# Patient Record
Sex: Female | Born: 1965 | Race: White | Hispanic: No | Marital: Married | Smoking: Former smoker
Health system: Southern US, Community
[De-identification: ages and names within clinical notes are randomized; demographics above are authoritative.]

## PROBLEM LIST (undated history)

## (undated) ENCOUNTER — Ambulatory Visit: Discharge status: Absent without leave | Payer: BC Managed Care – PPO

## (undated) DIAGNOSIS — Z800 Family history of malignant neoplasm of digestive organs: Secondary | ICD-10-CM

## (undated) DIAGNOSIS — Z8042 Family history of malignant neoplasm of prostate: Secondary | ICD-10-CM

## (undated) DIAGNOSIS — T7840XA Allergy, unspecified, initial encounter: Secondary | ICD-10-CM

## (undated) DIAGNOSIS — C50919 Malignant neoplasm of unspecified site of unspecified female breast: Secondary | ICD-10-CM

## (undated) DIAGNOSIS — Z923 Personal history of irradiation: Secondary | ICD-10-CM

## (undated) HISTORY — DX: Family history of malignant neoplasm of digestive organs: Z80.0

## (undated) HISTORY — PX: WISDOM TOOTH EXTRACTION: SHX21

## (undated) HISTORY — DX: Family history of malignant neoplasm of prostate: Z80.42

## (undated) HISTORY — PX: RHINOPLASTY: SUR1284

## (undated) HISTORY — DX: Malignant neoplasm of unspecified site of unspecified female breast: C50.919

---

## 2000-03-09 ENCOUNTER — Other Ambulatory Visit: Admission: RE | Admit: 2000-03-09 | Discharge: 2000-03-09 | Payer: Self-pay | Admitting: Obstetrics and Gynecology

## 2000-12-06 ENCOUNTER — Ambulatory Visit (HOSPITAL_COMMUNITY): Admission: RE | Admit: 2000-12-06 | Discharge: 2000-12-06 | Payer: Self-pay | Admitting: Obstetrics and Gynecology

## 2000-12-06 ENCOUNTER — Encounter: Payer: Self-pay | Admitting: Obstetrics and Gynecology

## 2001-02-24 ENCOUNTER — Ambulatory Visit (HOSPITAL_COMMUNITY): Admission: RE | Admit: 2001-02-24 | Discharge: 2001-02-24 | Payer: Self-pay | Admitting: Family Medicine

## 2001-03-14 ENCOUNTER — Encounter: Admission: RE | Admit: 2001-03-14 | Discharge: 2001-03-14 | Payer: Self-pay | Admitting: Obstetrics and Gynecology

## 2001-05-20 ENCOUNTER — Inpatient Hospital Stay (HOSPITAL_COMMUNITY): Admission: AD | Admit: 2001-05-20 | Discharge: 2001-05-23 | Payer: Self-pay | Admitting: Obstetrics and Gynecology

## 2001-06-30 ENCOUNTER — Other Ambulatory Visit: Admission: RE | Admit: 2001-06-30 | Discharge: 2001-06-30 | Payer: Self-pay | Admitting: Obstetrics and Gynecology

## 2002-07-14 ENCOUNTER — Other Ambulatory Visit: Admission: RE | Admit: 2002-07-14 | Discharge: 2002-07-14 | Payer: Self-pay | Admitting: Obstetrics and Gynecology

## 2003-12-24 ENCOUNTER — Other Ambulatory Visit: Admission: RE | Admit: 2003-12-24 | Discharge: 2003-12-24 | Payer: Self-pay | Admitting: Obstetrics and Gynecology

## 2004-05-12 ENCOUNTER — Ambulatory Visit (HOSPITAL_COMMUNITY): Admission: RE | Admit: 2004-05-12 | Discharge: 2004-05-12 | Payer: Self-pay | Admitting: Obstetrics and Gynecology

## 2004-12-19 ENCOUNTER — Ambulatory Visit (HOSPITAL_COMMUNITY): Admission: RE | Admit: 2004-12-19 | Discharge: 2004-12-19 | Payer: Self-pay | Admitting: Obstetrics and Gynecology

## 2005-03-01 ENCOUNTER — Inpatient Hospital Stay (HOSPITAL_COMMUNITY): Admission: RE | Admit: 2005-03-01 | Discharge: 2005-03-03 | Payer: Self-pay | Admitting: Obstetrics & Gynecology

## 2005-03-05 ENCOUNTER — Inpatient Hospital Stay (HOSPITAL_COMMUNITY): Admission: AD | Admit: 2005-03-05 | Discharge: 2005-03-05 | Payer: Self-pay | Admitting: Obstetrics and Gynecology

## 2005-03-29 ENCOUNTER — Other Ambulatory Visit: Admission: RE | Admit: 2005-03-29 | Discharge: 2005-03-29 | Payer: Self-pay | Admitting: Obstetrics & Gynecology

## 2005-12-17 ENCOUNTER — Encounter (INDEPENDENT_AMBULATORY_CARE_PROVIDER_SITE_OTHER): Payer: Self-pay | Admitting: *Deleted

## 2005-12-17 ENCOUNTER — Encounter: Admission: RE | Admit: 2005-12-17 | Discharge: 2005-12-17 | Payer: Self-pay | Admitting: Obstetrics and Gynecology

## 2006-01-16 ENCOUNTER — Ambulatory Visit (HOSPITAL_BASED_OUTPATIENT_CLINIC_OR_DEPARTMENT_OTHER): Admission: RE | Admit: 2006-01-16 | Discharge: 2006-01-16 | Payer: Self-pay | Admitting: Surgery

## 2006-01-16 ENCOUNTER — Encounter (INDEPENDENT_AMBULATORY_CARE_PROVIDER_SITE_OTHER): Payer: Self-pay | Admitting: Specialist

## 2007-05-26 ENCOUNTER — Encounter: Admission: RE | Admit: 2007-05-26 | Discharge: 2007-05-26 | Payer: Self-pay | Admitting: Obstetrics and Gynecology

## 2007-06-02 ENCOUNTER — Encounter: Admission: RE | Admit: 2007-06-02 | Discharge: 2007-06-02 | Payer: Self-pay | Admitting: Obstetrics and Gynecology

## 2007-06-04 ENCOUNTER — Encounter: Admission: RE | Admit: 2007-06-04 | Discharge: 2007-06-04 | Payer: Self-pay | Admitting: Obstetrics and Gynecology

## 2008-01-02 ENCOUNTER — Encounter: Admission: RE | Admit: 2008-01-02 | Discharge: 2008-01-02 | Payer: Self-pay | Admitting: Obstetrics and Gynecology

## 2008-05-19 ENCOUNTER — Encounter: Admission: RE | Admit: 2008-05-19 | Discharge: 2008-05-19 | Payer: Self-pay | Admitting: Obstetrics and Gynecology

## 2009-04-04 ENCOUNTER — Encounter: Admission: RE | Admit: 2009-04-04 | Discharge: 2009-04-04 | Payer: Self-pay | Admitting: Obstetrics and Gynecology

## 2009-05-27 ENCOUNTER — Encounter: Admission: RE | Admit: 2009-05-27 | Discharge: 2009-05-27 | Payer: Self-pay | Admitting: Obstetrics and Gynecology

## 2010-03-26 ENCOUNTER — Encounter: Payer: Self-pay | Admitting: Obstetrics and Gynecology

## 2010-07-10 ENCOUNTER — Other Ambulatory Visit: Payer: Self-pay | Admitting: Obstetrics and Gynecology

## 2010-07-10 DIAGNOSIS — Z1231 Encounter for screening mammogram for malignant neoplasm of breast: Secondary | ICD-10-CM

## 2010-07-14 ENCOUNTER — Ambulatory Visit
Admission: RE | Admit: 2010-07-14 | Discharge: 2010-07-14 | Disposition: A | Discharge status: Home / Self Care | Payer: 59 | Source: Ambulatory Visit | Attending: Obstetrics and Gynecology | Admitting: Obstetrics and Gynecology

## 2010-07-14 DIAGNOSIS — Z1231 Encounter for screening mammogram for malignant neoplasm of breast: Secondary | ICD-10-CM

## 2010-07-21 NOTE — Op Note (Signed)
[facility]  Patient:    Pamela Whitney, Pamela Whitney Visit Number: [id number] MRN: [id number]          Service Type: OBS Location: 910A 9142 01 Attending Physician:  Osborn Coho Dictated by:   Devoria Albe Edward Jolly, M.D. Proc. Date: 05/20/01 Admit Date:  05/20/2001                             Operative Report  PREOPERATIVE DIAGNOSES:       1. A 39 plus three weeks gestation.                               2. Fetal macrosomia.                               3. Gestational diabetes mellitus.  POSTOPERATIVE DIAGNOSES:      1. A 39 plus three weeks gestation.                               2. Fetal macrosomia.                               3. Gestational diabetes mellitus.  OPERATION:                    Primary low segment transverse cesarean section.  SURGEON:                      Brook A. Edward Jolly, M.D.  ASSISTANT:                    Janeece Riggers. Dareen Piano, M.D.  IV FLUIDS:                    3200 cc of Ringers lactate.  ESTIMATED BLOOD LOSS:         1000 cc.  URINE OUTPUT:                 75 cc.  COMPLICATIONS:                None.  INDICATIONS FOR PROCEDURE:    The patient was a 45 year old gravida 1, para 0, at 37 plus three weeks gestation who was noted on ultrasound one day prior to have fetal macrosomia.  The patients antepartum course was also significant for gestational diabetes mellitus which was diet-controlled.  The patient was also noted to be Rh negative and was treated appropriately with RhoGAM during the pregnancy.  A discussion was held with the patient regarding her diagnosis and the decision was made to proceed with a primary low segment transverse cesarean section after the risks, benefits, and alternatives were discussed with her.  FINDINGS:                     A viable female infant was delivered at 8:08 with Apgars of 8 at one minute and 9 at five minutes.  The weight was noted to be 9 pounds and 7 ounces.  The amniotic fluid was  clear and the uterus, tubes, and ovaries were noted to be normal.  The placenta had a normal insertion of a three vessel cord.  DESCRIPTION OF PROCEDURE:  With an IV in place, the patient was taken to the operating room after she was properly identified.  The patient received a spinal anesthetic and was placed in the supine position.  The abdomen was sterilely prepped and a Foley catheter was sterilely placed inside the bladder.  She was then sterilely draped.  After adequate anesthesia was ensured, a Pfannenstiel incision was created sharply with the scalpel.  The subcutaneous tissue was divided using cautery. The scalpel was used to divide the rectus fascia in the midline and the incision was extended sharply with the Mayo scissors.  The rectus muscles were then separated from the overlying fascia superiorly and inferiorly, and the rectus muscles were divided sharply using the Mayo scissors.  The parietal peritoneum was entered sharply with the Metzenbaum scissors after it was grasped and elevated with two hemostat clamps.  The parietal peritoneal incision was then extended cranially and caudally.  A bladder retractor was placed over the lower uterine segment and the bladder flap was created sharply.  A transverse lower uterine segment incision was then created with a scalpel and it was extended bluntly.  Membranes were ruptured and clear fluid was noted.  A hand was inserted through the incision and the vertex was delivered without difficulty followed by the remainder of the infant.  The nares and mouth were suctioned and the cord was doubly clamped and cut, and the newborn was carried over to the awaiting pediatricians.  The newborn was noted to be vigorous.  Cord blood was obtained and the placenta was manually extracted.  The patient did receive clindamycin 900 mg intravenously as well as Pitocin 20 units intravenously.  The uterus was exteriorized before its closure.  The  uterine incision was closed with a single running locked suture of 0 chromic.  The uterus was then returned to the peritoneal cavity which was irrigated and suctioned.  Hemostasis was excellent.  The rectus muscles were brought together in the midline by placing figure-of-eight sutures of 3-0 chromic. The fascia was next closed with a running suture of 0 Vicryl.  The subcutaneous tissue was then irrigated and small bleeding vessels were cauterized with monopolar cautery.  Hemostasis was good.  The skin was closed with staples followed by a sterile bandage.  The uterus was expressed of remaining clots and the patient was then sent to the recovery room in stable and awake condition.  There were no complications to the procedure.  All needle, instrument, and sponge counts were correct. Dictated by:   Devoria Albe Edward Jolly, M.D. Attending Physician:  Osborn Coho DD:  05/20/01 TD:  05/21/01 Job: [id number]

## 2010-07-21 NOTE — Discharge Summary (Signed)
NAME:  DALEYZA, GADOMSKI NO.:  [id number]   MEDICAL RECORD NO.:  [id number]          PATIENT TYPE:  INP   LOCATION:  9119                          FACILITY:  [facility]   PHYSICIAN:  Gerrit Friends. Aldona Bar, M.D.   DATE OF BIRTH:  1965/12/07   DATE OF ADMISSION:  03/01/2005  DATE OF DISCHARGE:  03/03/2005                                 DISCHARGE SUMMARY   DISCHARGE DIAGNOSES:  1.  Term pregnancy delivered, 7 pounds 14 ounces, female infant, Apgars 9      and 9.  2.  Blood type O negative - RhoGAM given.  3.  Previous cesarean section.  4.  Postoperative anemia.   PROCEDURE:  Repeat low-transverse cesarean section.   SUMMARY:  This 45 year old, gravida 3, para 1 was taken to the operating  room, on March 01, 2005,  for repeat cesarean section at term.  Intraoperatively, a significant amount of adhesions were encountered  involving the dome of the bladder, the lower uterine segment, and the  anterior peritoneum.  There was a moderate amount of bleeding encountered,  resulting in the patient's postoperative anemia. Otherwise, cesarean section  went well with the delivery of a 7-pound 14-ounce female infant with good  Apgars.  Mother's postpartum course, in spite of her anemia, was excellent  and she was on the second day postoperatively ready for discharge.  She was  ambulating well, tolerating a regular diet well.  Vital signs were stable.  Breast-feeding going well.  She was afebrile.  Her wound was clean and dry.  And accordingly, she was discharged to home. Discharge hemaglobin 7.4.   DISCHARGE MEDICATIONS:  1.  Vitamins - one a day.  2.  Feosol capsules - one daily.  3.  She will use Motrin 600 mg every six hours as needed for cramping or      pain.  4.  Tylox one to two every four to six hours as needed for more severe pain.   1.  She will return to return to the maternity admissions area in      approximately 48 hours for staple removal and to have her wound Steri-      Stripped with Benzoin.  2.  She will return to the office followup probably around the end of      January.  3.  She was given an instruction sheet at time of discharge and understood      all instructions well.   DISCHARGE CONDITION:  Improved.      Gerrit Friends. Aldona Bar, M.D.  Electronically Signed     RMW/MEDQ  D:  03/03/2005  T:  03/03/2005  Job:  [id number]

## 2010-07-21 NOTE — Op Note (Signed)
NAME:  Pamela Whitney, Pamela Whitney NO.:  [id number]   MEDICAL RECORD NO.:  [id number]          PATIENT TYPE:  AMB   LOCATION:  DSC                          FACILITY:  [facility]   PHYSICIAN:  Currie Paris, M.D.DATE OF BIRTH:  Dec 29, 1965   DATE OF PROCEDURE:  01/16/2006  DATE OF DISCHARGE:                                 OPERATIVE REPORT   CCS [id number]   PREOPERATIVE DIAGNOSIS:  Left breast mass.   POSTOPERATIVE DIAGNOSIS:  Left breast mass.   OPERATION:  Excision left breast mass.   SURGEON:  Currie Paris, M.D.   ANESTHESIA:  MAC.   CLINICAL HISTORY:  [name] is a 45 year old lady who has a palpable mass  in the left breast.  She had some atypia seen on core biopsy and excisional  biopsy was recommended.   DESCRIPTION OF PROCEDURE:  The patient seen in the holding area and she had  no further questions.  We confirmed the left excisional breast biopsy as  planned procedure and the left breast was marked by the patient and myself.   The patient was taken to the operating room and prior to being given any  sedation I was able to palpate and marked the mass which was really about  the 5:30 position with her supine and her left arm extended.  She was then  given IV sedation.  I confirmed the position with the ultrasound and thought  that I could see the clip placed.  This was in the area that we had already  marked on the skin.   The breast was then prepped and draped.  The time-out occurred.   I infiltrated a combination 1% Xylocaine and 0.5% Marcaine with epi mixed  equally.  I made a curvilinear incision at the areolar edge and raised the  skin up going inferiorly until I got to the area in question and was over  it.  I did an excisional biopsy down towards the chest wall.  There was a  lot of very firm tissue with some ducts that appeared to be filled with  creamy material consistent either with severe fibrocystic or perhaps DCIS.  This was an irregular  area that did not really have any good margins around  it.  I thought I was completely around the area but we did get into it  coming around but I thought that the final margins left behind were away  from all of this, although if this turns out to be malignant, I think a  wider excision would be necessary.   Once this was down I was able to easily palpate a hard mass in the middle of  what I had taken out as well as a lot of the fibrocystic type materials at  the edge.   I infiltrated more local to make sure everything would stay comfortable  postoperatively.  Once everything was dry I closed breast in layers with 3-0  Vicryl followed by 4-0 Monocryl subcuticular and some Dermabond.   The patient tolerated the procedure well and there no operative  complications.  All counts  were correct.      Currie Paris, M.D.  Electronically Signed     CJS/MEDQ  D:  01/16/2006  T:  01/16/2006  Job:  [id number]   cc:   Malva Limes, M.D.

## 2010-07-21 NOTE — Op Note (Signed)
NAME:  Pamela Whitney, Pamela Whitney NO.:  [id number]   MEDICAL RECORD NO.:  [id number]          PATIENT TYPE:  INP   LOCATION:  9199                          FACILITY:  [facility]   PHYSICIAN:  Gerrit Friends. Aldona Bar, M.D.   DATE OF BIRTH:  10/28/1965   DATE OF PROCEDURE:  03/01/2005  DATE OF DISCHARGE:                                 OPERATIVE REPORT   PREOPERATIVE DIAGNOSES:  1.  Term pregnancy.  2.  Desire for repeat cesarean section.   POSTOPERATIVE DIAGNOSES:  1.  Term pregnancy.  2.  Desire for repeat cesarean section.  3.  Delivery of 7 pound 14 ounce female infant, Apgars 9 and 9+.  4.  Adhesions involving lower uterine segment, anterior peritoneum and dome      of bladder.   PROCEDURES:  1.  Repeat low transverse cesarean section.  2.  Lysis of adhesions.   SURGEON:  Gerrit Friends. Aldona Bar, M.D.   ASSISTANT:  Miguel Aschoff, M.D.   ANESTHESIA:  Spinal.   HISTORY:  This 45 year old gravida 3, para 1, was admitted at term for  elective repeat cesarean section, having been delivered by Dr. Dareen Piano  suspected macrosomia with a previous pregnancy by cesarean section.   The patient was taken to the operating room, where after the induction of a  spinal anesthetic by Dr. Arby Barrette, she was prepped and draped, having been  placed in the supine position slightly tilted to the left.  A Foley catheter  was placed as part of the prep.  After good anesthetic levels were  documented, the procedure was begun.  A Pfannenstiel incision was made  dissecting down to and through the old scar to the fascia, which was  likewise incised a low transverse fashion.  The subfascial space was created  inferiorly and superiorly, muscles separated in the midline, peritoneum  identified with some difficulty very superiorly and once the peritoneum was  opened, it was apparent that there were very dense adhesions involving the  lower uterine segment, dome of the bladder and anterior peritoneum.  These  were taken  down sharply and bluntly, clearing the way to make a uterine  incision in a low transverse fashion, which was ultimately accomplished.  Amniotomy was produced with production of clear fluid and thereafter with  the vacuum extractor, a viable female infant which cried spontaneously at  once was delivered from a vertex presentation.  After the cord was clamped  and cut, the infant was have passed off to the a waiting team and subsequent  taken to the nursery in good condition.  Apgars were noted to be 9 and 9.  Subsequent weight was found to be 7 pounds 14 ounces.  After the cord bloods  were collected, the placenta was delivered intact.  The placenta was passed  off.  The uterus was exteriorized, rendered free of any remaining products  of conception.  Good uterine contractility was afforded with slowly slowly-  given intravenous Pitocin and manual stimulation.  Closure of the uterine  incision at this time was carried out using first a running locking layer of  #  1 Vicryl, followed by a running imbricating layer of #1 Vicryl, followed by  several figure-of-eight #1 Vicryls for additional hemostasis.  Ultimately  the uterus was reconstructed and hemostatic.  Tubes and ovaries were normal.  At this time the abdomen was lavaged of all free blood and clot and the  uterus was replaced into the abdominal cavity.  All counts were noted to be  correct at this time and no foreign bodies were noted to be remaining in the  abdominal cavity.  Closure of the abdomen was begun at this time in layers.  The abdominal peritoneum was closed with 0 Vicryl in a running fashion and  the muscle secured with same.  Assured of good subfascial hemostasis, the  fascia was then reapproximated using 0 Vicryl from angle to midline  bilaterally.  The subcutaneous tissue was rendered hemostatic and staples  were then used to close the skin.  A sterile pressure dressing was applied,  and the patient at this time was  transported to the recovery room in  satisfactory condition, having tolerated the procedure well.  At the  conclusion of the procedure, her urine was clear, output was good, the  uterus was well-contracted, the mother was stable and likewise the baby was  doing well and her respective recovery area.   In summary, this patient had a repeat cesarean section at term.  Complicating factors were adhesions involving the lower segment of the  uterus, dome of the bladder and the anterior peritoneum.  As mentioned, all  counts correct x2.  At the conclusion of the procedure both mother and baby  were doing well in their respective recovery areas.      Gerrit Friends. Aldona Bar, M.D.  Electronically Signed     RMW/MEDQ  D:  03/01/2005  T:  03/01/2005  Job:  [id number]

## 2010-07-21 NOTE — Discharge Summary (Signed)
[facility]  Patient:    Pamela Whitney, Pamela Whitney Visit Number: [id number] MRN: [id number]          Service Type: OBS Location: 910A 9142 01 Attending Physician:  Osborn Coho Dictated by:   Leilani Able, P.A. Admit Date:  05/20/2001 Discharge Date: 05/23/2001                             Discharge Summary  FINAL DIAGNOSES: 1. Intrauterine pregnancy at 39+ weeks gestation. 2. Fetal macrosomia. 3. Gestational diabetes mellitus.  PROCEDURE:  Primary low transverse cesarean section.  SURGEON:  Brook A. Edward Jolly, M.D.  ASSISTANT:  Janeece Riggers. Dareen Piano, M.D.  COMPLICATIONS:  None.  HISTORY OF PRESENT ILLNESS:  This 45 year old, G1, P0, was admitted at 39+ weeks gestation for a cesarean section secondary to fetal macrosomia noted on ultrasound.  The patients antepartum course was complicated by gestational diabetes which was diet controlled.  The patient was also Rh negative, but did receive RhoGAM at 28 weeks.  Discussion was held with the patient regarding her diagnosis and decision was made to proceed with a cesarean section.  HOSPITAL COURSE:  The patient was taken to the operating room on 05/20/01, by Dr. Conley Simmonds, where a primary low transverse cesarean section was performed with a delivery of a 9 pound 7 ounce female infant with Apgars of 8 and 9. The delivery went without complications, and the patients postoperative course was benign without significant fevers.  The patient was felt ready for discharge on postoperative day #3.  Was sent home on a regular diet, told to decrease activity, told to continue prenatal vitamins.  Was given Tylox one or two q.3h. p.r.n. pain, and told to follow up in the office in four weeks.  The patient was to also call with any fever or heavy bleeding. Dictated by:   Leilani Able, P.A. Attending Physician:  Osborn Coho DD:  06/12/01 TD:  06/13/01 Job: [id number]

## 2011-08-02 ENCOUNTER — Other Ambulatory Visit: Payer: Self-pay | Admitting: Obstetrics and Gynecology

## 2011-08-02 DIAGNOSIS — Z1231 Encounter for screening mammogram for malignant neoplasm of breast: Secondary | ICD-10-CM

## 2011-08-10 ENCOUNTER — Ambulatory Visit: Payer: 59

## 2011-08-17 ENCOUNTER — Ambulatory Visit
Admission: RE | Admit: 2011-08-17 | Discharge: 2011-08-17 | Disposition: A | Discharge status: Home / Self Care | Payer: BC Managed Care – PPO | Source: Ambulatory Visit | Attending: Obstetrics and Gynecology | Admitting: Obstetrics and Gynecology

## 2011-08-17 DIAGNOSIS — Z1231 Encounter for screening mammogram for malignant neoplasm of breast: Secondary | ICD-10-CM

## 2011-11-26 ENCOUNTER — Other Ambulatory Visit: Payer: Self-pay | Admitting: Obstetrics and Gynecology

## 2012-07-31 ENCOUNTER — Other Ambulatory Visit: Payer: Self-pay

## 2012-07-31 DIAGNOSIS — Z1231 Encounter for screening mammogram for malignant neoplasm of breast: Secondary | ICD-10-CM

## 2012-08-28 ENCOUNTER — Ambulatory Visit
Admission: RE | Admit: 2012-08-28 | Discharge: 2012-08-28 | Disposition: A | Discharge status: Home / Self Care | Payer: BC Managed Care – PPO | Source: Ambulatory Visit

## 2012-08-28 DIAGNOSIS — Z1231 Encounter for screening mammogram for malignant neoplasm of breast: Secondary | ICD-10-CM

## 2013-12-30 ENCOUNTER — Other Ambulatory Visit: Payer: Self-pay | Admitting: Obstetrics and Gynecology

## 2013-12-31 LAB — CYTOLOGY - PAP

## 2014-02-01 ENCOUNTER — Other Ambulatory Visit: Payer: Self-pay

## 2014-02-01 DIAGNOSIS — Z1231 Encounter for screening mammogram for malignant neoplasm of breast: Secondary | ICD-10-CM

## 2014-02-17 ENCOUNTER — Ambulatory Visit
Admission: RE | Admit: 2014-02-17 | Discharge: 2014-02-17 | Disposition: A | Discharge status: Home / Self Care | Payer: 59 | Source: Ambulatory Visit

## 2014-02-17 DIAGNOSIS — Z1231 Encounter for screening mammogram for malignant neoplasm of breast: Secondary | ICD-10-CM

## 2014-02-22 ENCOUNTER — Other Ambulatory Visit: Payer: Self-pay | Admitting: Obstetrics and Gynecology

## 2014-02-22 DIAGNOSIS — R928 Other abnormal and inconclusive findings on diagnostic imaging of breast: Secondary | ICD-10-CM

## 2014-03-09 ENCOUNTER — Ambulatory Visit
Admission: RE | Admit: 2014-03-09 | Discharge: 2014-03-09 | Disposition: A | Discharge status: Home / Self Care | Payer: 59 | Source: Ambulatory Visit | Attending: Obstetrics and Gynecology | Admitting: Obstetrics and Gynecology

## 2014-03-09 DIAGNOSIS — R928 Other abnormal and inconclusive findings on diagnostic imaging of breast: Secondary | ICD-10-CM

## 2015-05-03 ENCOUNTER — Other Ambulatory Visit: Payer: Self-pay

## 2015-05-03 DIAGNOSIS — Z1231 Encounter for screening mammogram for malignant neoplasm of breast: Secondary | ICD-10-CM

## 2015-05-16 ENCOUNTER — Ambulatory Visit
Admission: RE | Admit: 2015-05-16 | Discharge: 2015-05-16 | Disposition: A | Discharge status: Home / Self Care | Payer: 59 | Source: Ambulatory Visit

## 2015-05-16 DIAGNOSIS — Z1231 Encounter for screening mammogram for malignant neoplasm of breast: Secondary | ICD-10-CM

## 2015-05-17 ENCOUNTER — Other Ambulatory Visit: Payer: Self-pay | Admitting: Obstetrics and Gynecology

## 2015-05-18 LAB — CYTOLOGY - PAP

## 2015-05-19 ENCOUNTER — Other Ambulatory Visit: Payer: Self-pay | Admitting: Obstetrics and Gynecology

## 2015-05-19 DIAGNOSIS — R928 Other abnormal and inconclusive findings on diagnostic imaging of breast: Secondary | ICD-10-CM

## 2015-05-30 ENCOUNTER — Ambulatory Visit
Admission: RE | Admit: 2015-05-30 | Discharge: 2015-05-30 | Disposition: A | Discharge status: Home / Self Care | Payer: 59 | Source: Ambulatory Visit | Attending: Obstetrics and Gynecology | Admitting: Obstetrics and Gynecology

## 2015-05-30 ENCOUNTER — Other Ambulatory Visit: Payer: Self-pay | Admitting: Obstetrics and Gynecology

## 2015-05-30 DIAGNOSIS — R928 Other abnormal and inconclusive findings on diagnostic imaging of breast: Secondary | ICD-10-CM

## 2015-11-14 ENCOUNTER — Other Ambulatory Visit: Payer: Self-pay | Admitting: Obstetrics and Gynecology

## 2015-11-14 DIAGNOSIS — R922 Inconclusive mammogram: Secondary | ICD-10-CM

## 2015-12-08 ENCOUNTER — Other Ambulatory Visit: Payer: No Typology Code available for payment source

## 2015-12-12 ENCOUNTER — Ambulatory Visit
Admission: RE | Admit: 2015-12-12 | Discharge: 2015-12-12 | Disposition: A | Discharge status: Home / Self Care | Payer: 59 | Source: Ambulatory Visit | Attending: Obstetrics and Gynecology | Admitting: Obstetrics and Gynecology

## 2015-12-12 DIAGNOSIS — R922 Inconclusive mammogram: Secondary | ICD-10-CM

## 2016-03-05 HISTORY — PX: BREAST LUMPECTOMY: SHX2

## 2016-11-20 ENCOUNTER — Other Ambulatory Visit: Payer: Self-pay | Admitting: Obstetrics and Gynecology

## 2016-11-20 DIAGNOSIS — Z1231 Encounter for screening mammogram for malignant neoplasm of breast: Secondary | ICD-10-CM

## 2016-12-03 ENCOUNTER — Ambulatory Visit
Admission: RE | Admit: 2016-12-03 | Discharge: 2016-12-03 | Disposition: A | Discharge status: Home / Self Care | Payer: 59 | Source: Ambulatory Visit | Attending: Obstetrics and Gynecology | Admitting: Obstetrics and Gynecology

## 2016-12-03 DIAGNOSIS — Z1231 Encounter for screening mammogram for malignant neoplasm of breast: Secondary | ICD-10-CM

## 2016-12-05 ENCOUNTER — Other Ambulatory Visit: Payer: Self-pay | Admitting: Obstetrics and Gynecology

## 2016-12-05 DIAGNOSIS — R928 Other abnormal and inconclusive findings on diagnostic imaging of breast: Secondary | ICD-10-CM

## 2016-12-14 ENCOUNTER — Other Ambulatory Visit: Payer: Self-pay | Admitting: Obstetrics and Gynecology

## 2016-12-14 ENCOUNTER — Ambulatory Visit
Admission: RE | Admit: 2016-12-14 | Discharge: 2016-12-14 | Disposition: A | Discharge status: Home / Self Care | Payer: 59 | Source: Ambulatory Visit | Attending: Obstetrics and Gynecology | Admitting: Obstetrics and Gynecology

## 2016-12-14 DIAGNOSIS — N6320 Unspecified lump in the left breast, unspecified quadrant: Secondary | ICD-10-CM

## 2016-12-14 DIAGNOSIS — R928 Other abnormal and inconclusive findings on diagnostic imaging of breast: Secondary | ICD-10-CM

## 2016-12-18 ENCOUNTER — Ambulatory Visit
Admission: RE | Admit: 2016-12-18 | Discharge: 2016-12-18 | Disposition: A | Discharge status: Home / Self Care | Payer: 59 | Source: Ambulatory Visit | Attending: Obstetrics and Gynecology | Admitting: Obstetrics and Gynecology

## 2016-12-18 ENCOUNTER — Other Ambulatory Visit: Payer: Self-pay | Admitting: Obstetrics and Gynecology

## 2016-12-18 DIAGNOSIS — N6320 Unspecified lump in the left breast, unspecified quadrant: Secondary | ICD-10-CM

## 2016-12-20 ENCOUNTER — Telehealth: Payer: Self-pay | Admitting: Hematology

## 2016-12-20 ENCOUNTER — Telehealth: Payer: Self-pay | Admitting: *Deleted

## 2016-12-20 DIAGNOSIS — Z170 Estrogen receptor positive status [ER+]: Principal | ICD-10-CM

## 2016-12-20 DIAGNOSIS — C50312 Malignant neoplasm of lower-inner quadrant of left female breast: Secondary | ICD-10-CM

## 2016-12-20 NOTE — Telephone Encounter (Signed)
Confirmed with patient appointment for morning BC on 10/24, information emailed to the patient

## 2016-12-20 NOTE — Telephone Encounter (Signed)
Confirmed [facility] appt for 12/26/16 at 8:15am.

## 2016-12-21 NOTE — Progress Notes (Signed)
[facility]  [phone] [phone]  Clinic New Consult Note   Patient Care Team: Levi Aland, MD as PCP - General (Obstetrics and Gynecology) 12/26/2016  CHIEF COMPLAINTS/PURPOSE OF CONSULTATION:  Malignant neoplasm of lower-inner quadrant of left breast in female, estrogen receptor positive  Oncology History   Cancer Staging Malignant neoplasm of lower-inner quadrant of left breast in female, estrogen receptor positive (HCC) Staging form: Breast, AJCC 8th Edition - Clinical stage from 12/18/2016: cT1b, cN0, cM0, GX, ER: Positive, PR: Positive, HER2: Negative - Signed by Malachy Mood, MD on 12/25/2016       Malignant neoplasm of lower-inner quadrant of left breast in female, estrogen receptor positive (HCC)   12/14/2016 Mammogram    Diagnostic mammogram 12/14/16 IMPRESSION: Suspicious 9 x 7 x 9 mm irregular lobular hypoechoic left breast mass at the 630 o'clock 3 cm from the nipple, felt to correspond with mammographic focal architectural distortion. RECOMMENDATION: Ultrasound-guided core needle biopsy left breast mass.      12/18/2016 Initial Biopsy    Diagnosis 12/18/16 Breast, left, needle core biopsy, 6:30 o'clock - INVASIVE DUCTAL CARCINOMA, GRADE I.       12/18/2016 Receptors her2    Estrogen Receptor: 90%, POSITIVE, STRONG STAINING INTENSITY Progesterone Receptor: 90%, POSITIVE, STRONG STAINING INTENSITY Proliferation Marker Ki67: 5% HER2 NEGATIVE       12/20/2016 Initial Diagnosis    Malignant neoplasm of lower-inner quadrant of left breast in female, estrogen receptor positive (HCC)      HISTORY OF PRESENTING ILLNESS: 12/26/16 Pamela Whitney 51 y.o. female is here because of newly diagnosed left breast cancer. She presents to breast clinic today with her husband.   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 She had rhinoplasty at 50 yo and 2 C-sections. Her mother and  maternal grandmother had colon cancer. She smoked on and off for about 15 years, half a pack a week.   Today she notes her lump was found by screening mammogram. She denies any breast change, pain or fatigue. This year her periods have becomes less regular. She works in [employer], [employer]. She notes for a few months she started having hot flashes and night sweats, but homone test was done and she is likely pre or perimenopausal. In the past 6 months she has felt a small lump in the left side of her lump. She notices her fingers are swelling but no pain.    GYN HISTORY  Menarchal: 16 LMP: 12/12/16 Contraceptive: for 20 years, stopped 10 years ago.  HRT: NA GP: G3P2A1, miscarriage, first birth at 62. Has 2 girls    MEDICAL HISTORY:  History reviewed. No pertinent past medical history.  SURGICAL HISTORY: Past Surgical History:  Procedure Laterality Date  . CESAREAN SECTION    . RHINOPLASTY      SOCIAL HISTORY: Social History   Social History  . Marital status: Married    Spouse name: N/A  . Number of children: N/A  . Years of education: N/A   Occupational History  . Not on file.   Social History Main Topics  . Smoking status: Former Smoker    Packs/day: 0.10    Years: 15.00    Types: Cigarettes    Quit date: 12/26/2001  . Smokeless tobacco: Never Used  . Alcohol use 0.6 oz/week    1 Cans of beer per week     Comment: 3-5 times a week   . Drug  use: No  . Sexual activity: Not on file   Other Topics Concern  . Not on file   Social History Narrative  . No narrative on file    FAMILY HISTORY: Family History  Problem Relation Age of Onset  . Colon cancer Mother   . Colon cancer Maternal Grandmother   . Breast cancer Neg Hx     ALLERGIES:  is allergic to penicillins.  MEDICATIONS:  No current outpatient prescriptions on file.   No current facility-administered medications for this visit.     REVIEW OF SYSTEMS:   Constitutional: Denies fevers, chills (+) hot  flashes/night sweats Eyes: Denies blurriness of vision, double vision or watery eyes Ears, nose, mouth, throat, and face: Denies mucositis or sore throat (+) palpable lump in left throat Respiratory: Denies cough, dyspnea or wheezes Cardiovascular: Denies palpitation, chest discomfort or lower extremity swelling (+) slight swelling in hands Gastrointestinal:  Denies nausea, heartburn or change in bowel habits Skin: Denies abnormal skin rashes Lymphatics: Denies new lymphadenopathy or easy bruising Neurological:Denies numbness, tingling or new weaknesses Behavioral/Psych: Mood is stable, no new changes  All other systems were reviewed with the patient and are negative.  PHYSICAL EXAMINATION: ECOG PERFORMANCE STATUS: 0 - Asymptomatic  Vitals:   12/26/16 0854  BP: 120/65  Pulse: 67  Resp: 18  Temp: 99.4 F (37.4 C)  SpO2: 100%   Filed Weights   12/26/16 0854  Weight: 138 lb 3.2 oz (62.7 kg)    GENERAL:alert, no distress and comfortable SKIN: skin color, texture, turgor are normal, no rashes or significant lesions EYES: normal, conjunctiva are pink and non-injected, sclera clear OROPHARYNX:no exudate, no erythema and lips, buccal mucosa, and tongue normal  NECK: supple, thyroid normal size, non-tender (+)  1 cm palpable  lymph node in the anterior upper neck, movable, tender  LYMPH:  no palpable lymphadenopathy in the cervical, axillary or inguinal LUNGS: clear to auscultation and percussion with normal breathing effort HEART: regular rate & rhythm and no murmurs and no lower extremity edema ABDOMEN:abdomen soft, non-tender and normal bowel sounds Musculoskeletal:no cyanosis of digits and no clubbing  PSYCH: alert & oriented x 3 with fluent speech NEURO: no focal motor/sensory deficits BREAST: (+) mild skin echymosis in LOQ of left breast at biopsy site, small lump, possible hematoma   LABORATORY DATA:  I have reviewed the data as listed CBC Latest Ref Rng & Units 12/26/2016    WBC 3.9 - 10.3 10e3/uL 5.1  Hemoglobin 11.6 - 15.9 g/dL 12.9  Hematocrit 34.8 - 46.6 % 36.9  Platelets 145 - 400 10e3/uL 267    CMP Latest Ref Rng & Units 12/26/2016  Glucose 70 - 140 mg/dl 101  BUN 7.0 - 26.0 mg/dL 11.1  Creatinine 0.6 - 1.1 mg/dL 0.9  Sodium 136 - 145 mEq/L 138  Potassium 3.5 - 5.1 mEq/L 4.4  CO2 22 - 29 mEq/L 25  Calcium 8.4 - 10.4 mg/dL 9.1  Total Protein 6.4 - 8.3 g/dL 7.4  Total Bilirubin 0.20 - 1.20 mg/dL 0.50  Alkaline Phos 40 - 150 U/L 56  AST 5 - 34 U/L 20  ALT 0 - 55 U/L 18    PATHOLOGY  Diagnosis 12/18/16 Breast, left, needle core biopsy, 6:30 o'clock - INVASIVE DUCTAL CARCINOMA, GRADE I. Microscopic Comment Breast prognostic profile will be performed. Immunohistochemistry for calponin, smooth muscle myosin and p63 show a lack of basal cell staining supporting the diagnosis. 1 of 2 FINAL for IRMALEE, RIEMENSCHNEIDER ([id number]) Microscopic Comment(continued) Dr. Melina Copa agrees.  Called to [facility] on 12/19/16 and 12/20/16. PROGNOSTIC INDICATORS Results: IMMUNOHISTOCHEMICAL AND MORPHOMETRIC ANALYSIS PERFORMED MANUALLY Estrogen Receptor: 90%, POSITIVE, STRONG STAINING INTENSITY Progesterone Receptor: 90%, POSITIVE, STRONG STAINING INTENSITY Proliferation Marker Ki67: 5% REFERENCE RANGE ESTROGEN RECEPTOR NEGATIVE 0% POSITIVE =>1% REFERENCE RANGE PROGESTERONE RECEPTOR NEGATIVE 0% POSITIVE =>1% All controls stained appropriately   RADIOGRAPHIC STUDIES: I have personally reviewed the radiological images as listed and agreed with the findings in the report. US Breast Ltd Uni Left Inc Axilla  Result Date: 12/14/2016 CLINICAL DATA:  Patient recalled from screening for left breast distortion. EXAM: 2D DIGITAL DIAGNOSTIC LEFT MAMMOGRAM WITH CAD AND ADJUNCT TOMO ULTRASOUND LEFT BREAST COMPARISON:  Previous exam(s). ACR Breast Density Category c: The breast tissue is heterogeneously dense, which may obscure small masses. FINDINGS:  Persistent focal architectural distortion within the inferior slightly medial left breast middle depth, further evaluated with spot compression CC tomosynthesis and full paddle true lateral views. Mammographic images were processed with CAD. On physical exam, I palpate dense tissue within the lower inner left breast. Targeted ultrasound is performed, showing a 9 x 7 x 9 mm irregular lobular hypoechoic left breast mass 630 o'clock 3 cm from the nipple, felt to correspond with mammographic abnormality. No left axillary lymphadenopathy. IMPRESSION: Suspicious irregular hypoechoic left breast mass, felt to correspond with mammographic focal architectural distortion. RECOMMENDATION: Ultrasound-guided core needle biopsy left breast mass. I have discussed the findings and recommendations with the patient. Results were also provided in writing at the conclusion of the visit. If applicable, a reminder letter will be sent to the patient regarding the next appointment. BI-RADS CATEGORY  4: Suspicious. Electronically Signed   By: Lovey Newcomer M.D.   On: 12/14/2016 16:45   Mm Diag Breast Tomo Uni Left  Result Date: 12/14/2016 CLINICAL DATA:  Patient recalled from screening for left breast distortion. EXAM: 2D DIGITAL DIAGNOSTIC LEFT MAMMOGRAM WITH CAD AND ADJUNCT TOMO ULTRASOUND LEFT BREAST COMPARISON:  Previous exam(s). ACR Breast Density Category c: The breast tissue is heterogeneously dense, which may obscure small masses. FINDINGS: Persistent focal architectural distortion within the inferior slightly medial left breast middle depth, further evaluated with spot compression CC tomosynthesis and full paddle true lateral views. Mammographic images were processed with CAD. On physical exam, I palpate dense tissue within the lower inner left breast. Targeted ultrasound is performed, showing a 9 x 7 x 9 mm irregular lobular hypoechoic left breast mass 630 o'clock 3 cm from the nipple, felt to correspond with mammographic  abnormality. No left axillary lymphadenopathy. IMPRESSION: Suspicious irregular hypoechoic left breast mass, felt to correspond with mammographic focal architectural distortion. RECOMMENDATION: Ultrasound-guided core needle biopsy left breast mass. I have discussed the findings and recommendations with the patient. Results were also provided in writing at the conclusion of the visit. If applicable, a reminder letter will be sent to the patient regarding the next appointment. BI-RADS CATEGORY  4: Suspicious. Electronically Signed   By: Lovey Newcomer M.D.   On: 12/14/2016 16:45   Mm Screening Breast Tomo Bilateral  Result Date: 12/04/2016 CLINICAL DATA:  Screening. Patient reportedly has a history of prior surgical excision of left breast in 2007 for atypical ductal hyperplasia per mammogram report December 12, 2015 EXAM: 2D DIGITAL SCREENING BILATERAL MAMMOGRAM WITH CAD AND ADJUNCT TOMO COMPARISON:  Previous exam(s). ACR Breast Density Category c: The breast tissue is heterogeneously dense, which may obscure small masses. FINDINGS: In the left breast, a possible mass with architectural distortion warrants further evaluation. This finding  is new compared to prior mammogram of February 17, 2014. In the right breast, no findings suspicious for malignancy. Images were processed with CAD. IMPRESSION: Further evaluation is suggested for possible mass in the left breast. RECOMMENDATION: Diagnostic mammogram and possibly ultrasound of the left breast. (Code:[id number]-[id number]) The patient will be contacted regarding the findings, and additional imaging will be scheduled. BI-RADS CATEGORY  0: Incomplete. Need additional imaging evaluation and/or prior mammograms for comparison. Electronically Signed   By: Abelardo Diesel M.D.   On: 12/04/2016 16:25   Mm Clip Placement Left  Result Date: 12/18/2016 CLINICAL DATA:  Post biopsy mammogram of the left breast for clip placement year EXAM: DIAGNOSTIC LEFT MAMMOGRAM POST ULTRASOUND BIOPSY  COMPARISON:  Previous exam(s). FINDINGS: Mammographic images were obtained following ultrasound guided biopsy of left breast mass at 6:30. The ribbon shaped biopsy marking clip is appropriately positioned at the site of the biopsied mass at 6:30 in the left breast. IMPRESSION: Appropriate positioning of the ribbon shaped biopsy marking clip in the left breast at 6:30. Final Assessment: Post Procedure Mammograms for Marker Placement Electronically Signed   By: Ammie Ferrier M.D.   On: 12/18/2016 16:37   [place] Lt Breast Bx W Loc Dev 1st Lesion Img Bx Spec US Guide  Addendum Date: 12/20/2016   ADDENDUM REPORT: 12/20/2016 13:50 ADDENDUM: Pathology revealed GRADE I INVASIVE DUCTAL CARCINOMA of the Left breast, 6:30 o'clock. This was found to be concordant by Dr. Ammie Ferrier. Pathology results were discussed with the patient by telephone. The patient reported doing well after the biopsy with tenderness at the site. Post biopsy instructions and care were reviewed and questions were answered. The patient was encouraged to call [facility] for any additional concerns. The patient was referred to [facility] on December 26, 2016. Consideration for bilateral breast MRI due to previous history of high risk lesions and density. Pathology results reported by Terie Purser, RN on 12/20/2016. Electronically Signed   By: Ammie Ferrier M.D.   On: 12/20/2016 13:50   Result Date: 12/20/2016 CLINICAL DATA:  51 year old female presenting for ultrasound-guided biopsy of a left breast mass. EXAM: ULTRASOUND GUIDED LEFT BREAST CORE NEEDLE BIOPSY COMPARISON:  Previous exam(s). FINDINGS: I met with the patient and we discussed the procedure of ultrasound-guided biopsy, including benefits and alternatives. We discussed the high likelihood of a successful procedure. We discussed the risks of the procedure, including infection,  bleeding, tissue injury, clip migration, and inadequate sampling. Informed written consent was given. The usual time-out protocol was performed immediately prior to the procedure. Lesion quadrant: Lower-inner quadrant Using sterile technique and 1% Lidocaine as local anesthetic, under direct ultrasound visualization, a 14 gauge spring-loaded device was used to perform biopsy of a mass in the left breast at 6:30 using a lateral approach. At the conclusion of the procedure a ribbon shaped tissue marker clip was deployed into the biopsy cavity. Follow up 2 view mammogram was performed and dictated separately. IMPRESSION: Ultrasound guided biopsy of breast mass at 6:30. No apparent complications. Electronically Signed: By: Ammie Ferrier M.D. On: 12/18/2016 16:26    ASSESSMENT & PLAN:  Pamela Whitney is a 51 y.o. caucasian female with no significant medical history.    1. Malignant neoplasm of lower-inner quadrant of left breast, Stage IA, c (T1b,N0,M0 ), ER/PR: positive, HER2 Negative, Grade I --We discussed her imaging findings and the biopsy results in great details. -Giving the early stage  disease, she is likely a candidate for lumpectomy and sentinel lymph node biopsy. She is agreeable with that. She was seen by Dr. Donne Hazel today and likely will proceed with surgery soon.  -due to her prior history of ADH, and dense breast tissue, Dr. Donne Hazel recommends a bilateral breast MRI to ruled out other lesions. -I recommend a Oncotype Dx test on the surgical sample and we'll make a decision about adjuvant chemotherapy based on the Oncotype result. Written material of this test was given to her. She is young and fit, would be a good candidate for chemotherapy if her Oncotype recurrence score is high. -If her surgical sentinel lymph node node positive, I recommend mammaprint for further risk stratification and guide adjuvant chemotherapy. -Giving the strong ER and PR expression in her premenopausal  status, I recommend Tamoxifen as adjuvant endocrine therapy with aromatase inhibitor for a total of 5-10 years to reduce the risk of cancer recurrence. Potential benefits and side effects were discussed with patient and she is interested. -She was also seen by radiation oncologist Dr. Lisbeth Renshaw today. If her surgical sentinel lymph nodes were negative, she would not need post mastectomy radiation.  -We also discussed the breast cancer surveillance after her surgery. She will continue annual screening mammogram, self exam, and a routine office visit with lab and exam with [place]. -I encouraged her to have healthy diet and exercise regularly.  -Labs reviewed and her CBC and CMP are WNL -F/u after surgery if high risk Oncotype results or after radiation    2. Genetic Testing -although she has no family history of breast/ovarian cancer, due to her young age she is interested in genetic testing, will refer her  -She agreed to proceed with testing today    PLAN:  -Genetic referral -she will proceed left breast lumpectomy and sentinel lymph node biopsy by Dr. Donne Hazel soon -F/u after surgery if high risk Oncotype results or after radiation if low risk disease    No orders of the defined types were placed in this encounter.   All questions were answered. The patient knows to call the clinic with any problems, questions or concerns. I spent 55 minutes counseling the patient face to face. The total time spent in the appointment was 60 minutes and more than 50% was on counseling.     Truitt Merle, MD 12/26/2016 11:14 AM  This document serves as a record of services personally performed by Truitt Merle, MD. It was created on her behalf by Joslyn Devon, a trained medical scribe. The creation of this record is based on the scribe's personal observations and the provider's statements to them. This document has been checked and approved by the attending provider.

## 2016-12-25 ENCOUNTER — Other Ambulatory Visit: Payer: 59

## 2016-12-26 ENCOUNTER — Encounter: Payer: Self-pay | Admitting: Hematology

## 2016-12-26 ENCOUNTER — Encounter: Payer: Self-pay | Admitting: Genetics

## 2016-12-26 ENCOUNTER — Ambulatory Visit
Admission: RE | Admit: 2016-12-26 | Discharge: 2016-12-26 | Disposition: A | Discharge status: Home / Self Care | Payer: 59 | Source: Ambulatory Visit | Attending: Radiation Oncology | Admitting: Radiation Oncology

## 2016-12-26 ENCOUNTER — Ambulatory Visit (HOSPITAL_BASED_OUTPATIENT_CLINIC_OR_DEPARTMENT_OTHER): Payer: 59 | Admitting: Genetics

## 2016-12-26 ENCOUNTER — Encounter: Payer: Self-pay | Admitting: Physical Therapy

## 2016-12-26 ENCOUNTER — Ambulatory Visit (HOSPITAL_BASED_OUTPATIENT_CLINIC_OR_DEPARTMENT_OTHER): Payer: 59 | Admitting: Hematology

## 2016-12-26 ENCOUNTER — Other Ambulatory Visit (HOSPITAL_BASED_OUTPATIENT_CLINIC_OR_DEPARTMENT_OTHER): Payer: 59

## 2016-12-26 ENCOUNTER — Other Ambulatory Visit: Payer: Self-pay | Admitting: *Deleted

## 2016-12-26 ENCOUNTER — Encounter: Payer: Self-pay | Admitting: *Deleted

## 2016-12-26 ENCOUNTER — Ambulatory Visit: Payer: 59 | Attending: General Surgery | Admitting: Physical Therapy

## 2016-12-26 VITALS — BP 120/65 | HR 67 | Temp 99.4°F | Resp 18 | Ht 64.5 in | Wt 138.2 lb

## 2016-12-26 DIAGNOSIS — Z808 Family history of malignant neoplasm of other organs or systems: Secondary | ICD-10-CM | POA: Insufficient documentation

## 2016-12-26 DIAGNOSIS — Z170 Estrogen receptor positive status [ER+]: Principal | ICD-10-CM

## 2016-12-26 DIAGNOSIS — Z510 Encounter for antineoplastic radiation therapy: Secondary | ICD-10-CM | POA: Insufficient documentation

## 2016-12-26 DIAGNOSIS — Z87891 Personal history of nicotine dependence: Secondary | ICD-10-CM | POA: Insufficient documentation

## 2016-12-26 DIAGNOSIS — Z800 Family history of malignant neoplasm of digestive organs: Secondary | ICD-10-CM

## 2016-12-26 DIAGNOSIS — C50312 Malignant neoplasm of lower-inner quadrant of left female breast: Secondary | ICD-10-CM | POA: Insufficient documentation

## 2016-12-26 DIAGNOSIS — Z8042 Family history of malignant neoplasm of prostate: Secondary | ICD-10-CM

## 2016-12-26 DIAGNOSIS — Z7183 Encounter for nonprocreative genetic counseling: Secondary | ICD-10-CM

## 2016-12-26 DIAGNOSIS — R293 Abnormal posture: Secondary | ICD-10-CM

## 2016-12-26 DIAGNOSIS — C50411 Malignant neoplasm of upper-outer quadrant of right female breast: Secondary | ICD-10-CM | POA: Insufficient documentation

## 2016-12-26 DIAGNOSIS — Z9889 Other specified postprocedural states: Secondary | ICD-10-CM | POA: Insufficient documentation

## 2016-12-26 DIAGNOSIS — R221 Localized swelling, mass and lump, neck: Secondary | ICD-10-CM

## 2016-12-26 LAB — CBC WITH DIFFERENTIAL/PLATELET
BASO%: 0.5 % (ref 0.0–2.0)
BASOS ABS: 0 10*3/uL (ref 0.0–0.1)
EOS%: 1.6 % (ref 0.0–7.0)
Eosinophils Absolute: 0.1 10*3/uL (ref 0.0–0.5)
HEMATOCRIT: 36.9 % (ref 34.8–46.6)
HGB: 12.9 g/dL (ref 11.6–15.9)
LYMPH#: 1.9 10*3/uL (ref 0.9–3.3)
LYMPH%: 36.8 % (ref 14.0–49.7)
MCH: 32.7 pg (ref 25.1–34.0)
MCHC: 35 g/dL (ref 31.5–36.0)
MCV: 93.5 fL (ref 79.5–101.0)
MONO#: 0.3 10*3/uL (ref 0.1–0.9)
MONO%: 6.4 % (ref 0.0–14.0)
NEUT#: 2.8 10*3/uL (ref 1.5–6.5)
NEUT%: 54.7 % (ref 38.4–76.8)
PLATELETS: 267 10*3/uL (ref 145–400)
RBC: 3.94 10*6/uL (ref 3.70–5.45)
RDW: 12.5 % (ref 11.2–14.5)
WBC: 5.1 10*3/uL (ref 3.9–10.3)

## 2016-12-26 LAB — COMPREHENSIVE METABOLIC PANEL
ALT: 18 U/L (ref 0–55)
ANION GAP: 7 meq/L (ref 3–11)
AST: 20 U/L (ref 5–34)
Albumin: 4 g/dL (ref 3.5–5.0)
Alkaline Phosphatase: 56 U/L (ref 40–150)
BILIRUBIN TOTAL: 0.5 mg/dL (ref 0.20–1.20)
BUN: 11.1 mg/dL (ref 7.0–26.0)
CALCIUM: 9.1 mg/dL (ref 8.4–10.4)
CO2: 25 mEq/L (ref 22–29)
CREATININE: 0.9 mg/dL (ref 0.6–1.1)
Chloride: 106 mEq/L (ref 98–109)
EGFR: >60 mL/min/{1.73_m2} (ref >60)
Glucose: 101 mg/dl (ref 70–140)
Potassium: 4.4 mEq/L (ref 3.5–5.1)
Sodium: 138 mEq/L (ref 136–145)
TOTAL PROTEIN: 7.4 g/dL (ref 6.4–8.3)

## 2016-12-26 NOTE — Progress Notes (Signed)
Radiation Oncology         [phone] ________________________________  Name: Pamela Whitney        MRN: [id number]  Date of Service: 12/26/2016 DOB: 08-May-1965  CC:Levi Aland, MD  Emelia Loron, MD     REFERRING PHYSICIAN: Emelia Loron, MD   DIAGNOSIS: The encounter diagnosis was Malignant neoplasm of lower-inner quadrant of left breast in female, estrogen receptor positive (HCC).   HISTORY OF PRESENT ILLNESS: Pamela Whitney is a 51 y.o. female seen in the multidisciplinary breast clinic for a new diagnosis of left breast cancer. She has a history of atypical ductal hyperplasia that she underwent left lumpectomy for at the age of 60. She did not take antiestrogen medication following this. Recently on screening mammogram, there was a detected left breast abnormality. She underwent diagnostic imaging and this revealed a 9 x 7 x 9 mm lesion at 6:30 and her axilla was negative by ultrasound. 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 She comes today to discuss treatment options for her cancer.     PREVIOUS RADIATION THERAPY: No   PAST MEDICAL HISTORY: No past medical history on file.     PAST SURGICAL HISTORY: Past Surgical History:  Procedure Laterality Date  . CESAREAN SECTION    . RHINOPLASTY       FAMILY HISTORY:  Family History  Problem Relation Age of Onset  . Colon cancer Mother   . Colon cancer Maternal Grandmother   . Breast cancer Neg Hx      SOCIAL HISTORY:  reports that she quit smoking about 15 years ago. Her smoking use included Cigarettes. She smoked 1.50 packs per day. She does not have any smokeless tobacco history on file. She reports that she drinks alcohol. She reports that she does not use drugs. The patient is married and lives in [place]. 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 She has two  children that are 32 and 15, and who are active in gymnastics and Dealer.   ALLERGIES: Penicillins   MEDICATIONS:  No current outpatient prescriptions on file.   No current facility-administered medications for this encounter.      REVIEW OF SYSTEMS: On review of systems, the patient reports that she is doing well overall. She denies any chest pain, shortness of breath, cough, fevers, chills, night sweats, unintended weight changes. She denies any bowel or bladder disturbances, and denies abdominal pain, nausea or vomiting. She denies any new musculoskeletal or joint aches or pains. A complete review of systems is obtained and is otherwise negative.     PHYSICAL EXAM:  Wt Readings from Last 3 Encounters:  12/26/16 138 lb 3.2 oz (62.7 kg)   Temp Readings from Last 3 Encounters:  12/26/16 99.4 F (37.4 C) (Oral)   BP Readings from Last 3 Encounters:  12/26/16 120/65   Pulse Readings from Last 3 Encounters:  12/26/16 67     In general this is a well appearing caucasian female in no acute distress. She is alert and oriented x4 and appropriate throughout the examination. HEENT reveals that the patient is normocephalic, atraumatic. EOMs are intact. PERRLA. Skin is intact without any evidence of gross lesions. Cardiovascular exam reveals a regular rate and rhythm, no clicks rubs or murmurs are auscultated. Chest is clear to auscultation bilaterally. Lymphatic assessment is performed and  does not reveal any adenopathy in the supraclavicular, axillary, or inguinal chains. The left neck reveals fullness in the left neck that is nonspecific. Bilateral breast exam is performed and reveals mild ecchymosis of the left breast without palpable fullness. No mass is noted in the right breast. No nipple bleeding or discharge is noted of either breast.  Abdomen has active bowel sounds in all quadrants and is intact. The abdomen is soft, non tender, non distended. Lower  extremities are negative for pretibial pitting edema, deep calf tenderness, cyanosis or clubbing.   ECOG = 0  0 - Asymptomatic (Fully active, able to carry on all predisease activities without restriction)  1 - Symptomatic but completely ambulatory (Restricted in physically strenuous activity but ambulatory and able to carry out work of a light or sedentary nature. For example, light housework, office work)  2 - Symptomatic, <50% in bed during the day (Ambulatory and capable of all self care but unable to carry out any work activities. Up and about more than 50% of waking hours)  3 - Symptomatic, >50% in bed, but not bedbound (Capable of only limited self-care, confined to bed or chair 50% or more of waking hours)  4 - Bedbound (Completely disabled. Cannot carry on any self-care. Totally confined to bed or chair)  5 - Death   Eustace Pen MM, Creech RH, Tormey DC, et al. [id number]). "Toxicity and response criteria of the [facility]". [place] Oncol. 5 (6): 649-55    LABORATORY DATA:  Lab Results  Component Value Date   WBC 5.1 12/26/2016   HGB 12.9 12/26/2016   HCT 36.9 12/26/2016   MCV 93.5 12/26/2016   PLT 267 12/26/2016   Lab Results  Component Value Date   NA 138 12/26/2016   K 4.4 12/26/2016   CO2 25 12/26/2016   Lab Results  Component Value Date   ALT 18 12/26/2016   AST 20 12/26/2016   ALKPHOS 56 12/26/2016   BILITOT 0.50 12/26/2016      RADIOGRAPHY: US Breast Ltd Uni Left Inc Axilla  Result Date: 12/14/2016 CLINICAL DATA:  Patient recalled from screening for left breast distortion. EXAM: 2D DIGITAL DIAGNOSTIC LEFT MAMMOGRAM WITH CAD AND ADJUNCT TOMO ULTRASOUND LEFT BREAST COMPARISON:  Previous exam(s). ACR Breast Density Category c: The breast tissue is heterogeneously dense, which may obscure small masses. FINDINGS: Persistent focal architectural distortion within the inferior slightly medial left breast middle depth, further evaluated with  spot compression CC tomosynthesis and full paddle true lateral views. Mammographic images were processed with CAD. On physical exam, I palpate dense tissue within the lower inner left breast. Targeted ultrasound is performed, showing a 9 x 7 x 9 mm irregular lobular hypoechoic left breast mass 630 o'clock 3 cm from the nipple, felt to correspond with mammographic abnormality. No left axillary lymphadenopathy. IMPRESSION: Suspicious irregular hypoechoic left breast mass, felt to correspond with mammographic focal architectural distortion. RECOMMENDATION: Ultrasound-guided core needle biopsy left breast mass. I have discussed the findings and recommendations with the patient. Results were also provided in writing at the conclusion of the visit. If applicable, a reminder letter will be sent to the patient regarding the next appointment. BI-RADS CATEGORY  4: Suspicious. Electronically Signed   By: Lovey Newcomer M.D.   On: 12/14/2016 16:45   Mm Diag Breast Tomo Uni Left  Result Date: 12/14/2016 CLINICAL DATA:  Patient recalled from screening for left breast distortion. EXAM: 2D DIGITAL DIAGNOSTIC LEFT MAMMOGRAM WITH CAD AND ADJUNCT TOMO ULTRASOUND  LEFT BREAST COMPARISON:  Previous exam(s). ACR Breast Density Category c: The breast tissue is heterogeneously dense, which may obscure small masses. FINDINGS: Persistent focal architectural distortion within the inferior slightly medial left breast middle depth, further evaluated with spot compression CC tomosynthesis and full paddle true lateral views. Mammographic images were processed with CAD. On physical exam, I palpate dense tissue within the lower inner left breast. Targeted ultrasound is performed, showing a 9 x 7 x 9 mm irregular lobular hypoechoic left breast mass 630 o'clock 3 cm from the nipple, felt to correspond with mammographic abnormality. No left axillary lymphadenopathy. IMPRESSION: Suspicious irregular hypoechoic left breast mass, felt to correspond with  mammographic focal architectural distortion. RECOMMENDATION: Ultrasound-guided core needle biopsy left breast mass. I have discussed the findings and recommendations with the patient. Results were also provided in writing at the conclusion of the visit. If applicable, a reminder letter will be sent to the patient regarding the next appointment. BI-RADS CATEGORY  4: Suspicious. Electronically Signed   By: Annia Belt M.D.   On: 12/14/2016 16:45   Mm Screening Breast Tomo Bilateral  Result Date: 12/04/2016 CLINICAL DATA:  Screening. Patient reportedly has a history of prior surgical excision of left breast in 2007 for atypical ductal hyperplasia per mammogram report December 12, 2015 EXAM: 2D DIGITAL SCREENING BILATERAL MAMMOGRAM WITH CAD AND ADJUNCT TOMO COMPARISON:  Previous exam(s). ACR Breast Density Category c: The breast tissue is heterogeneously dense, which may obscure small masses. FINDINGS: In the left breast, a possible mass with architectural distortion warrants further evaluation. This finding is new compared to prior mammogram of February 17, 2014. In the right breast, no findings suspicious for malignancy. Images were processed with CAD. IMPRESSION: Further evaluation is suggested for possible mass in the left breast. RECOMMENDATION: Diagnostic mammogram and possibly ultrasound of the left breast. (Code:[id number]) The patient will be contacted regarding the findings, and additional imaging will be scheduled. BI-RADS CATEGORY  0: Incomplete. Need additional imaging evaluation and/or prior mammograms for comparison. Electronically Signed   By: Sherian Rein M.D.   On: 12/04/2016 16:25   Mm Clip Placement Left  Result Date: 12/18/2016 CLINICAL DATA:  Post biopsy mammogram of the left breast for clip placement year EXAM: DIAGNOSTIC LEFT MAMMOGRAM POST ULTRASOUND BIOPSY COMPARISON:  Previous exam(s). FINDINGS: Mammographic images were obtained following ultrasound guided biopsy of left breast mass at  6:30. The ribbon shaped biopsy marking clip is appropriately positioned at the site of the biopsied mass at 6:30 in the left breast. IMPRESSION: Appropriate positioning of the ribbon shaped biopsy marking clip in the left breast at 6:30. Final Assessment: Post Procedure Mammograms for Marker Placement Electronically Signed   By: Frederico Hamman M.D.   On: 12/18/2016 16:37   [place] Lt Breast Bx W Loc Dev 1st Lesion Img Bx Spec US Guide  Addendum Date: 12/20/2016   ADDENDUM REPORT: 12/20/2016 13:50 ADDENDUM: Pathology revealed GRADE I INVASIVE DUCTAL CARCINOMA of the Left breast, 6:30 o'clock. This was found to be concordant by Dr. Frederico Hamman. Pathology results were discussed with the patient by telephone. The patient reported doing well after the biopsy with tenderness at the site. Post biopsy instructions and care were reviewed and questions were answered. The patient was encouraged to call [facility] for any additional concerns. The patient was referred to [facility] on December 26, 2016. Consideration for bilateral breast MRI due to previous history of high  risk lesions and density. Pathology results reported by Terie Purser, RN on 12/20/2016. Electronically Signed   By: Ammie Ferrier M.D.   On: 12/20/2016 13:50   Result Date: 12/20/2016 CLINICAL DATA:  51 year old female presenting for ultrasound-guided biopsy of a left breast mass. EXAM: ULTRASOUND GUIDED LEFT BREAST CORE NEEDLE BIOPSY COMPARISON:  Previous exam(s). FINDINGS: I met with the patient and we discussed the procedure of ultrasound-guided biopsy, including benefits and alternatives. We discussed the high likelihood of a successful procedure. We discussed the risks of the procedure, including infection, bleeding, tissue injury, clip migration, and inadequate sampling. Informed written consent was given. The usual time-out protocol  was performed immediately prior to the procedure. Lesion quadrant: Lower-inner quadrant Using sterile technique and 1% Lidocaine as local anesthetic, under direct ultrasound visualization, a 14 gauge spring-loaded device was used to perform biopsy of a mass in the left breast at 6:30 using a lateral approach. At the conclusion of the procedure a ribbon shaped tissue marker clip was deployed into the biopsy cavity. Follow up 2 view mammogram was performed and dictated separately. IMPRESSION: Ultrasound guided biopsy of breast mass at 6:30. No apparent complications. Electronically Signed: By: Ammie Ferrier M.D. On: 12/18/2016 16:26       IMPRESSION/PLAN: 1. 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 Dr. Lisbeth Renshaw discusses the pathology findings and reviews the nature of invasive breast disease. The consensus from the breast conference include breast conservation with lumpectomy with  sentinel mapping. Because of her prior ADH in the left breast, she will have bilateral breast MRI, and because of a fullness in her neck on exam, she will undergo CT neck. Provided she plans to proceed with surgery next, she would undergo breast conservation.  Depending on the size of the final tumor measurements rendered by pathology, the tumor may be tested for oncotype dx score to determine a role for systemic therapy. Provided that chemotherapy is not indicated, the patient's course would then be followed by external radiotherapy to the breast followed by antiestrogen therapy. We discussed the risks, benefits, short, and long term effects of radiotherapy, and the patient is interested in proceeding. Dr. Lisbeth Renshaw discusses the delivery and logistics of radiotherapy and anticipates a course of 6 1/2 with deep inspiration breath hold technique weeks. We will see her back about 2 weeks after surgery to move forward with the simulation and planning process and anticipate starting radiotherapy about 4 weeks  after surgery.  2. Possible genetic predisposition to cancer. The patient is interested in meeting with genetic counseling and is referred. She will meet with genetics today.   The above documentation reflects my direct findings during this shared patient visit. Please see the separate note by Dr. Lisbeth Renshaw on this date for the remainder of the patient's plan of care.    Carola Rhine, PAC

## 2016-12-26 NOTE — Progress Notes (Signed)
Nutrition Assessment  Reason for Assessment:  Pt seen in Breast Clinic  ASSESSMENT:   51 year old female with new diagnosis of breast cancer.  Past medical history reviewed.    Patient reports normal appetite  Medications:  reviewed  Labs: reviewed  Anthropometrics:   Height: 64.5 inches Weight: 138 lb BMI: 23   NUTRITION DIAGNOSIS: Food and nutrition related knowledge deficit related to new diagnosis of breast cancer as evidenced by no prior need for nutrition related information.  INTERVENTION:   Discussed and provided packet of information regarding nutritional tips for breast cancer patients.  Questions answered.  Teachback method used.  Contact information provided and patient knows to contact me with questions/concerns.    MONITORING, EVALUATION, and GOAL: Pt will consume a healthy plant based diet to maintain lean body mass throughout treatment.   Huntley Demedeiros B. Zenia Resides, [place], [place] Registered Dietitian [phone] (pager)

## 2016-12-26 NOTE — Patient Instructions (Signed)

## 2016-12-26 NOTE — Therapy (Signed)
[place], [place], [address] Phone: [phone]   Fax:  [phone]  Physical Therapy Evaluation  Patient Details  Name: Pamela Whitney MRN: [id number] Date of Birth: May 18, 1965 Referring Provider: Dr. Rolm Bookbinder  Encounter Date: 12/26/2016      PT End of Session - 12/26/16 1030    Visit Number 1   Number of Visits 2   Date for PT Re-Evaluation 02/25/17   PT Start Time 4034   PT Stop Time 7425  Also saw pt from 9563 to 1048 for a total of 30 minutes   PT Time Calculation (min) 22 min   Activity Tolerance Patient tolerated treatment well   Behavior During Therapy [facility] for tasks assessed/performed      History reviewed. No pertinent past medical history.  Past Surgical History:  Procedure Laterality Date  . CESAREAN SECTION    . RHINOPLASTY      There were no vitals filed for this visit.       Subjective Assessment - 12/26/16 1023    Subjective Patient reports she is here today to be seen by her medical team for her newly diagnosed left breast cancer.   Patient is accompained by: Family member   Pertinent History Patient was diagnosed on 12/03/16 with left grade 1 invasive ductal carcinoma breast cancer. It measures 9 mm and is located in the lower inner quadrant. It is ER/PR positive and HER2 negative with a Ki67 of 5%.    Patient Stated Goals reduce lymphedema risk and learn post op shoulder ROM HEP   Currently in Pain? No/denies            [facility] PT Assessment - 12/26/16 0001      Assessment   Medical Diagnosis Left breast cancer   Referring Provider Dr. Rolm Bookbinder   Onset Date/Surgical Date 12/03/16   Hand Dominance Right   Prior Therapy none     Precautions   Precautions Other (comment)   Precaution Comments active cancer     Restrictions   Weight Bearing Restrictions No     Balance Screen   Has the patient fallen in the past 6 months No   Has the  patient had a decrease in activity level because of a fear of falling?  No   Is the patient reluctant to leave their home because of a fear of falling?  No     Home Environment   Living Environment Private residence   Living Arrangements Spouse/significant other;Children  Husband, 29 and 76 y.o. daughters   Available Help at Discharge Family     Prior Function   Level of Independence Independent   Vocation Full time employment   Vocation Requirements HR - office work   Leisure She does not exercise     Cognition   Overall Cognitive Status Within Functional Limits for tasks assessed     Posture/Postural Control   Posture/Postural Control Postural limitations   Postural Limitations Rounded Shoulders;Forward head   Posture Comments Reports she works hard to correct posture since she sits much of the day     ROM / Strength   AROM / PROM / Strength AROM;Strength     AROM   AROM Assessment Site Shoulder;Cervical   Right/Left Shoulder Right;Left   Right Shoulder Extension 45 Degrees   Right Shoulder Flexion 161 Degrees   Right Shoulder ABduction 170 Degrees   Right Shoulder Internal Rotation 85 Degrees   Right Shoulder External Rotation 88 Degrees  Left Shoulder Extension 50 Degrees   Left Shoulder Flexion 164 Degrees   Left Shoulder ABduction 164 Degrees   Left Shoulder Internal Rotation 84 Degrees   Left Shoulder External Rotation 90 Degrees   Cervical Flexion WNL   Cervical Extension WNL   Cervical - Right Side Bend WNL   Cervical - Left Side Bend WNL   Cervical - Right Rotation WNL   Cervical - Left Rotation WNL     Strength   Overall Strength Within functional limits for tasks performed           LYMPHEDEMA/ONCOLOGY QUESTIONNAIRE - 12/26/16 1029      Type   Cancer Type Left breast cancer     Lymphedema Assessments   Lymphedema Assessments Upper extremities     Right Upper Extremity Lymphedema   10 cm Proximal to Olecranon Process 25 cm   Olecranon  Process 23.4 cm   10 cm Proximal to Ulnar Styloid Process 21.1 cm   Just Proximal to Ulnar Styloid Process 14.9 cm   Across Hand at [employer] 17.7 cm   At [place] of 2nd Digit 6.2 cm     Left Upper Extremity Lymphedema   10 cm Proximal to Olecranon Process 24.8 cm   Olecranon Process 23.3 cm   10 cm Proximal to Ulnar Styloid Process 20 cm   Just Proximal to Ulnar Styloid Process 14.5 cm   Across Hand at [employer] 17 cm   At [place] of 2nd Digit 6.1 cm         Objective measurements completed on examination: See above findings.      Patient was instructed today in a home exercise program today for post op shoulder range of motion. These included active assist shoulder flexion in sitting, scapular retraction, wall walking with shoulder abduction, and hands behind head external rotation.  She was encouraged to do these twice a day, holding 3 seconds and repeating 5 times when permitted by her physician.         PT Education - 12/26/16 1029    Education provided Yes   Education Details Lymphedema risk reduction and post op shoulder ROM HEP   Person(s) Educated Patient   Methods Explanation;Demonstration;Handout   Comprehension Returned demonstration;Verbalized understanding              Breast Clinic Goals - 12/26/16 1054      Patient will be able to verbalize understanding of pertinent lymphedema risk reduction practices relevant to her diagnosis specifically related to skin care.   Time 1   Period Days   Status Achieved     Patient will be able to return demonstrate and/or verbalize understanding of the post-op home exercise program related to regaining shoulder range of motion.   Time 1   Period Days   Status Achieved     Patient will be able to verbalize understanding of the importance of attending the postoperative After Breast Cancer Class for further lymphedema risk reduction education and therapeutic exercise.   Time 1   Period Days   Status  Achieved               Plan - 12/26/16 1034    Clinical Impression Statement Patient was diagnosed on 12/03/16 with left grade 1 invasive ductal carcinoma breast cancer. It measures 9 mm and is located in the lower inner quadrant. It is ER/PR positive and HER2 negative with a Ki67 of 5%. Her multidisciplinary medical team met prior to her assessments to  detemrine a recommended treatment plan. She is planning to have a left lumpectomy and sentinel node biopsy followed by Oncotype testing, radiation, and anti-estrogen therapy. She will benefit from post op PT to reassess and detemrine PT needs for ROM and reducing lymphedema risk.   History and Personal Factors relevant to plan of care: Recent cancer diagnosis   Clinical Presentation Stable   Clinical Decision Making Low   Rehab Potential Excellent   Clinical Impairments Affecting Rehab Potential None   PT Frequency --  Eval and a f/u visit after surgery   PT Treatment/Interventions ADLs/Self Care Home Management;Therapeutic exercise;Patient/family education   PT Next Visit Plan reassess 3-4 weeks post op after surgery to determine PT needs   PT Home Exercise Plan Post op shoulder ROM HEP   Consulted and Agree with Plan of Care Patient;Family member/caregiver   Family Member Consulted Husband      Patient will benefit from skilled therapeutic intervention in order to improve the following deficits and impairments:  Decreased range of motion, Impaired UE functional use, Pain, Decreased knowledge of precautions, Postural dysfunction  Visit Diagnosis: Malignant neoplasm of lower-inner quadrant of left breast in female, estrogen receptor positive ([place]) - Plan: PT plan of care cert/re-cert  Abnormal posture - Plan: PT plan of care cert/re-cert   Patient will follow up at outpatient cancer rehab 3-4 weeks following surgery.  If the patient requires physical therapy at that time, a specific plan will be dictated and sent to the referring  physician for approval. The patient was educated today on appropriate basic range of motion exercises to begin post operatively and the importance of attending the After Breast Cancer class following surgery.  Patient was educated today on lymphedema risk reduction practices as it pertains to recommendations that will benefit the patient immediately following surgery.  She verbalized good understanding.     Problem List Patient Active Problem List   Diagnosis Date Noted  . Malignant neoplasm of lower-inner quadrant of left breast in female, estrogen receptor positive ([place]) 12/20/2016    Annia Friendly, PT 12/26/16 10:56 AM  [place], [place], [address] Phone: [phone]   Fax:  [phone]  Name: Pamela Whitney MRN: [id number] Date of Birth: 08/11/1965

## 2016-12-26 NOTE — Progress Notes (Signed)
Clinical Social Work [place] Psychosocial Distress Screening [place]  Patient completed distress screening protocol and scored a 3 on the Psychosocial Distress Thermometer which indicates mild distress. Clinical Social Worker met with patient and patients husband in [facility] to assess for distress and other psychosocial needs. Patient stated she was feeling overwhelmed but felt "better" after meeting with the treatment team and getting more information on her treatment plan. CSW and patient discussed common feeling and emotions when being diagnosed with cancer, and the importance of support during treatment. CSW informed patient of the support team and support services at [facility]. CSW provided contact information and encouraged patient to call with any questions or concerns.  ONCBCN DISTRESS SCREENING 12/26/2016  Screening Type Initial Screening  Distress experienced in past week (1-10) 3  Information Concerns Type Lack of info about diagnosis;Lack of info about treatment     Johnnye Lana, MSW, LCSW, OSW-C Clinical Social Worker [facility] [phone]

## 2016-12-26 NOTE — Progress Notes (Signed)
REFERRING PROVIDER: Rolm Bookbinder, MD [place], [place] [address]  PRIMARY PROVIDER:  Olga Millers, MD  PRIMARY REASON FOR VISIT:  1. Malignant neoplasm of lower-inner quadrant of left breast in female, estrogen receptor positive ([place])      HISTORY OF PRESENT ILLNESS:   Pamela Whitney, a 51 y.o. female, was seen for a [place] cancer genetics consultation at the request of Dr. Donne Hazel due to a personal and family history of cancer.  Pamela Whitney presents to clinic today to discuss the possibility of a hereditary predisposition to cancer, genetic testing, and to further clarify her future cancer risks, as well as potential cancer risks for family members.    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 She is considering treatment options at this time.  She is currently planning to have a lumpectomy.  Adjuvant treatment will depend on pathology and Oncotype results.  Tamoxifen was recommended to her as well.    She has a history of breast ADH in 2007.   CANCER HISTORY:  Oncology History   Cancer Staging Malignant neoplasm of lower-inner quadrant of left breast in female, estrogen receptor positive ([place]) Staging form: Breast, AJCC 8th Edition - Clinical stage from 12/18/2016: cT1b, cN0, cM0, GX, ER: Positive, PR: Positive, HER2: Negative - Signed by Truitt Merle, MD on 12/25/2016       Malignant neoplasm of lower-inner quadrant of left breast in female, estrogen receptor positive ([place])   12/14/2016 Mammogram    Diagnostic mammogram 12/14/16 IMPRESSION: Suspicious 9 x 7 x 9 mm irregular lobular hypoechoic left breast mass at the 630 o'clock 3 cm from the nipple, felt to correspond with mammographic focal architectural distortion. RECOMMENDATION: Ultrasound-guided core needle biopsy left breast mass.      12/18/2016 Initial Biopsy    Diagnosis 12/18/16 Breast, left, needle core biopsy, 6:30  o'clock - INVASIVE DUCTAL CARCINOMA, GRADE I.       12/18/2016 Receptors her2    Estrogen Receptor: 90%, POSITIVE, STRONG STAINING INTENSITY Progesterone Receptor: 90%, POSITIVE, STRONG STAINING INTENSITY Proliferation Marker Ki67: 5% HER2 NEGATIVE       12/20/2016 Initial Diagnosis    Malignant neoplasm of lower-inner quadrant of left breast in female, estrogen receptor positive ([place])       HORMONAL RISK FACTORS:  Menarche was at age 58.  First live birth at age 69.  OCP use for approximately 20 years.  Ovaries intact: yes.  Hysterectomy: no.  Menopausal status: perimenopausal.  HRT use: 0 years. Colonoscopy: no; 'insurance wont cover until 69 even with fhx'. Mammogram within the last year: yes.  No past medical history on file.  Past Surgical History:  Procedure Laterality Date  . CESAREAN SECTION    . RHINOPLASTY      Social History   Social History  . Marital status: Married    Spouse name: N/A  . Number of children: N/A  . Years of education: N/A   Social History Main Topics  . Smoking status: Former Smoker    Packs/day: 0.10    Years: 15.00    Types: Cigarettes    Quit date: 12/26/2001  . Smokeless tobacco: Never Used  . Alcohol use 0.6 oz/week    1 Cans of beer per week     Comment: 3-5 times a week   . Drug use: No  . Sexual activity: Not on file   Other Topics Concern  .  Not on file   Social History Narrative  . No narrative on file     FAMILY HISTORY:  We obtained a detailed, 4-generation family history.  Significant diagnoses are listed below: Family History  Problem Relation Age of Onset  . Colon cancer Mother   . Colon cancer Maternal Grandmother   . Breast cancer Neg Hx    Pamela Whitney has 2 daughters ages 76 and 60 with no history of cancer.  Pamela Whitney has 1 maternal half-sister who is 81 with no history of cancer.   Pamela Whitney' father is 34 and was diagnosed with prostate cancer at 15.  He had his prostate removed, but did not have  radiation or any further therapy.  Pamela Whitney has a paternal uncle who died of HIV/AIDS.  She has a paternal cousin who is 83 with no history of cancer.  Pamela Whitney has a paternal aunt who is 39.  This aunt has a grandson who was diagnosed with a spinal cancer at 49 year of age and is now 32.  Pamela Whitney' paternal grandfather died in his 9's/80's after a stroke.  Her paternal grandmother died in her 51's/80's with no known history of cancer.  Pamela Whitney explains that her paternal family does not discuss health issues that much, so she is limited in her knowledge of their health.   Pamela Whitney' mother was diagnosed with colon cancer in her 53's and is currently 22.  She had a hysterectomy in her early 46's, but Pamela Whitney does not know the reason.  Pamela Whitney has 1 maternal aunt who is 68 with no children or any history of cancer.  Pamela Whitney' maternal grandfather died at 19 with no history of cancer.  Her maternal grandmother was diagnosed with colon cancer in her 47's and died at 67.   Pamela Whitney is unaware of previous family history of genetic testing for hereditary cancer risks. Patient's maternal ancestors are of Welsh/Northern European descent, and paternal ancestors are of Biochemist, clinical European descent. There is no reported Ashkenazi Jewish ancestry. There is no known consanguinity.  GENETIC COUNSELING ASSESSMENT: Pamela Whitney is a 51 y.o. female with a personal and family history which is somewhat suggestive of a Hereditary Cancer Predisposition Syndrome. We, therefore, discussed and recommended the following at today's visit.   DISCUSSION:  We reviewed the characteristics, features and inheritance patterns of hereditary cancer syndromes. We also discussed genetic testing, including the appropriate family members to test, the process of testing, insurance coverage and turn-around-time for results. We discussed the implications of a negative, positive and/or variant of uncertain significant result. In order  to get genetic test results in a timely manner so that Pamela Whitney can use these genetic test results for surgical decisions, we offered  Pamela Whitney the option to pursue genetic testing for the Breast Cancer STAT panel. The STAT Breast cancer panel offered by [facility] includes sequencing and rearrangement analysis for the following 9 genes:  ATM, BRCA1, BRCA2, CDH1, CHEK2, PALB2, PTEN, STK11 and TP53.    If this test is negative, we then recommend Pamela. Griffy pursue reflex genetic testing to the Common Hereditary Cancer gene panel. The Hereditary Gene Panel offered by [facility] includes sequencing and/or deletion duplication testing of the following 47 genes: APC, ATM, AXIN2, BARD1, BMPR1A, BRCA1, BRCA2, BRIP1, CDH1, CDKN2A (p14ARF), CDKN2A (p16INK4a), CKD4, CHEK2, CTNNA1, DICER1, EPCAM (Deletion/duplication testing only), GREM1 (promoter region deletion/duplication testing only), KIT, MEN1, MLH1, MSH2, MSH3, MSH6, MUTYH, NBN, NF1, NHTL1, PALB2, PDGFRA,  PMS2, POLD1, POLE, PTEN, RAD50, RAD51C, RAD51D, SDHB, SDHC, SDHD, SMAD4, SMARCA4. STK11, TP53, TSC1, TSC2, and VHL.  The following genes were evaluated for sequence changes only: SDHA and HOXB13 c.251G>A variant only.  We discussed that only 5-10% of cancers are associated with a Hereditary cancer predisposition syndrome.  One of the most common hereditary cancer syndromes that increases breast cancer risk is called Hereditary Breast and Ovarian Cancer (HBOC) syndrome.  This syndrome is caused by mutations in the BRCA1 and BRCA2 genes.  This syndrome increases an individual's lifetime risk to develop breast, ovarian, pancreatic, and other types of cancer.  There are also many other cancer predisposition syndromes caused by mutations in several other genes.  We discussed that if she is found to have a mutation in one of these genes, it may impact surgical decisions, and alter future medical management recommendations such as increased cancer screenings and consideration  of risk reducing surgeries.  A positive result could also have implications for the patient's family members.  A Negative result would mean we were unable to identify a hereditary component to her cancer, but does not rule out the possibility of a hereditary basis for her cancer.  There could be mutations that are undetectable by current technology, or in genes not yet tested or identified to increase cancer risk.    We discussed the potential to find a Variant of Uncertain Significance or VUS.  These are variants that have not yet been identified as pathogenic or benign, and it is unknown if this variant is associated with increased cancer risk or if this is a normal finding.  Most VUS's are reclassified to benign or likely benign.   It should not be used to make medical management decisions. With time, we suspect the lab will determine the significance of any VUS's identified if any.   We discussed with Pamela. Mcdowell that the family history is not highly consistent with a familial hereditary cancer syndrome, and we feel she is at low risk to harbor a gene mutation associated with such a condition. Thus, we did not recommend any genetic testing, at this time, and recommended Pamela. Buell continue to follow the cancer screening guidelines given by her primary healthcare provider.  However, we still offered her the genetic testing discussed above.    PLAN: After considering the risks, benefits, and limitations, Pamela. Jurgens  provided informed consent to pursue genetic testing and the blood sample was sent to [facility] for analysis of the Breast Cancer STAT panel with reflex testing to the Common Hereditary Cancer Panel. Results should be available within approximately 2-3 weeks' time, at which point they will be disclosed by telephone to Pamela. Hidrogo, as will any additional recommendations warranted by these results. Pamela. Foronda will receive a summary of her genetic counseling visit and a copy of her results once  available. This information will also be available in Epic. We encouraged Pamela. Ionescu to remain in contact with cancer genetics annually so that we can continuously update the family history and inform her of any changes in cancer genetics and testing that may be of benefit for her family. Pamela. Reckart questions were answered to her satisfaction today. Our contact information was provided should additional questions or concerns arise.  Lastly, we encouraged Pamela. Wyeth to remain in contact with cancer genetics annually so that we can continuously update the family history and inform her of any changes in cancer genetics and testing that may be of benefit for this family.  Pamela.  Gorley questions were answered to her satisfaction today. Our contact information was provided should additional questions or concerns arise. Thank you for the referral and allowing [place] to share in the care of your patient.   Tana Felts, Pamela Genetic Counselor [email or web address] phone: [phone]  The patient was seen for a total of 30 minutes in face-to-face genetic counseling.  The patient was accompanied today by her husband This patient was discussed with Drs. Magrinat, Lindi Adie and/or Burr Medico who agrees with the above.

## 2016-12-27 ENCOUNTER — Encounter: Payer: Self-pay | Admitting: Hematology

## 2016-12-28 ENCOUNTER — Telehealth: Payer: Self-pay | Admitting: Genetics

## 2016-12-28 ENCOUNTER — Ambulatory Visit (HOSPITAL_COMMUNITY)
Admission: RE | Admit: 2016-12-28 | Discharge: 2016-12-28 | Disposition: A | Discharge status: Home / Self Care | Payer: 59 | Source: Ambulatory Visit | Attending: General Surgery | Admitting: General Surgery

## 2016-12-28 DIAGNOSIS — R221 Localized swelling, mass and lump, neck: Secondary | ICD-10-CM | POA: Diagnosis present

## 2016-12-28 MED ORDER — IOPAMIDOL (ISOVUE-300) INJECTION 61%
INTRAVENOUS | Status: AC
  Filled 2016-12-28: qty 75

## 2016-12-28 MED ORDER — IOPAMIDOL (ISOVUE-300) INJECTION 61%
75.0000 mL | Freq: Once | INTRAVENOUS | Status: AC | PRN
  Administered 2016-12-28: 75 mL via INTRAVENOUS

## 2016-12-28 NOTE — Telephone Encounter (Signed)
Patient will come in on Monday 10/29 before MRI to get additional blood sample drawn.  I scheduled her with an appointment at 1:45pm on 10/29 .

## 2016-12-31 ENCOUNTER — Ambulatory Visit (HOSPITAL_COMMUNITY)
Admission: RE | Admit: 2016-12-31 | Discharge: 2016-12-31 | Disposition: A | Discharge status: Home / Self Care | Payer: 59 | Source: Ambulatory Visit | Attending: General Surgery | Admitting: General Surgery

## 2016-12-31 ENCOUNTER — Other Ambulatory Visit: Payer: 59

## 2016-12-31 DIAGNOSIS — N6311 Unspecified lump in the right breast, upper outer quadrant: Secondary | ICD-10-CM | POA: Insufficient documentation

## 2016-12-31 DIAGNOSIS — C50312 Malignant neoplasm of lower-inner quadrant of left female breast: Secondary | ICD-10-CM | POA: Diagnosis not present

## 2016-12-31 DIAGNOSIS — R221 Localized swelling, mass and lump, neck: Secondary | ICD-10-CM | POA: Diagnosis present

## 2016-12-31 MED ORDER — GADOBENATE DIMEGLUMINE 529 MG/ML IV SOLN
15.0000 mL | Freq: Once | INTRAVENOUS | Status: AC | PRN
  Administered 2016-12-31: 14 mL via INTRAVENOUS

## 2017-01-01 ENCOUNTER — Other Ambulatory Visit: Payer: Self-pay | Admitting: General Surgery

## 2017-01-01 ENCOUNTER — Telehealth: Payer: Self-pay | Admitting: *Deleted

## 2017-01-01 DIAGNOSIS — R9389 Abnormal findings on diagnostic imaging of other specified body structures: Secondary | ICD-10-CM

## 2017-01-01 NOTE — Telephone Encounter (Signed)
  Oncology Nurse Navigator Documentation  Navigator Location: [facility] (01/01/17 1500)   )Navigator Encounter Type: Telephone;MDC Follow-up (01/01/17 1500) Telephone: Outgoing Call;Clinic/[facility]C Follow-up (01/01/17 1500)                                                  Time Spent with Patient: 15 (01/01/17 1500)

## 2017-01-03 ENCOUNTER — Ambulatory Visit
Admission: RE | Admit: 2017-01-03 | Discharge: 2017-01-03 | Disposition: A | Discharge status: Home / Self Care | Payer: 59 | Source: Ambulatory Visit | Attending: General Surgery | Admitting: General Surgery

## 2017-01-03 DIAGNOSIS — R9389 Abnormal findings on diagnostic imaging of other specified body structures: Secondary | ICD-10-CM

## 2017-01-03 DIAGNOSIS — C50919 Malignant neoplasm of unspecified site of unspecified female breast: Secondary | ICD-10-CM

## 2017-01-03 HISTORY — DX: Malignant neoplasm of unspecified site of unspecified female breast: C50.919

## 2017-01-04 ENCOUNTER — Other Ambulatory Visit: Payer: 59

## 2017-01-07 ENCOUNTER — Telehealth: Payer: Self-pay | Admitting: Genetics

## 2017-01-07 NOTE — Telephone Encounter (Signed)
Revealed preliminary genetic test results- negative.   This normal result indicates that there were no pathogenic variants identified in the 9 high risk breast cancer genes that were analyzed.  There are no recommendations to alter breast surgical decisions from a genetics standpoint.  However, genetic testing is not perfect and there could still be a hereditary cause for her cancer that cannot be detected with current technology or current knowledge.  We will have to wait for these results of her full panel.  I will call her when these full results are available.

## 2017-01-08 ENCOUNTER — Telehealth: Payer: Self-pay | Admitting: Genetics

## 2017-01-08 ENCOUNTER — Encounter: Payer: Self-pay | Admitting: Genetics

## 2017-01-08 ENCOUNTER — Ambulatory Visit: Payer: Self-pay | Admitting: Genetics

## 2017-01-08 ENCOUNTER — Ambulatory Visit
Admission: RE | Admit: 2017-01-08 | Discharge: 2017-01-08 | Disposition: A | Discharge status: Home / Self Care | Payer: 59 | Source: Ambulatory Visit | Attending: General Surgery | Admitting: General Surgery

## 2017-01-08 DIAGNOSIS — Z8042 Family history of malignant neoplasm of prostate: Secondary | ICD-10-CM

## 2017-01-08 DIAGNOSIS — R9389 Abnormal findings on diagnostic imaging of other specified body structures: Secondary | ICD-10-CM

## 2017-01-08 DIAGNOSIS — Z170 Estrogen receptor positive status [ER+]: Principal | ICD-10-CM

## 2017-01-08 DIAGNOSIS — C50312 Malignant neoplasm of lower-inner quadrant of left female breast: Secondary | ICD-10-CM

## 2017-01-08 DIAGNOSIS — Z1379 Encounter for other screening for genetic and chromosomal anomalies: Secondary | ICD-10-CM | POA: Insufficient documentation

## 2017-01-08 DIAGNOSIS — Z800 Family history of malignant neoplasm of digestive organs: Secondary | ICD-10-CM

## 2017-01-08 MED ORDER — GADOBENATE DIMEGLUMINE 529 MG/ML IV SOLN: 13.0000 mL | Freq: Once | INTRAVENOUS | Status: DC | PRN

## 2017-01-08 NOTE — Progress Notes (Signed)
HPI: Ms. Gannett was previously seen in the [facility] clinic on 12/26/2016 due to a personal and family history of cancer and concerns regarding a hereditary predisposition to cancer. Please refer to our prior cancer genetics clinic note for more information regarding Ms. Keo's medical, social and family histories, and our assessment and recommendations, at the time. Ms. Beechy recent genetic test results were disclosed to her, as well as recommendations warranted by these results. These results and recommendations are discussed in more detail below.  CANCER HISTORY:  Oncology History   Cancer Staging Malignant neoplasm of lower-inner quadrant of left breast in female, estrogen receptor positive ([place]) Staging form: Breast, AJCC 8th Edition - Clinical stage from 12/18/2016: cT1b, cN0, cM0, GX, ER: Positive, PR: Positive, HER2: Negative - Signed by Truitt Merle, MD on 12/25/2016       Malignant neoplasm of lower-inner quadrant of left breast in female, estrogen receptor positive ([place])   12/14/2016 Mammogram    Diagnostic mammogram 12/14/16 IMPRESSION: Suspicious 9 x 7 x 9 mm irregular lobular hypoechoic left breast mass at the 630 o'clock 3 cm from the nipple, felt to correspond with mammographic focal architectural distortion. RECOMMENDATION: Ultrasound-guided core needle biopsy left breast mass.      12/18/2016 Initial Biopsy    Diagnosis 12/18/16 Breast, left, needle core biopsy, 6:30 o'clock - INVASIVE DUCTAL CARCINOMA, GRADE I.       12/18/2016 Receptors her2    Estrogen Receptor: 90%, POSITIVE, STRONG STAINING INTENSITY Progesterone Receptor: 90%, POSITIVE, STRONG STAINING INTENSITY Proliferation Marker Ki67: 5% HER2 NEGATIVE       12/20/2016 Initial Diagnosis    Malignant neoplasm of lower-inner quadrant of left breast in female, estrogen receptor positive ([place])       FAMILY HISTORY:  We obtained a detailed, 4-generation family history.  Significant  diagnoses are listed below: Family History  Problem Relation Age of Onset  . Colon cancer Mother 26  . Prostate cancer Father 27       prostectomy, no radiation  . Colon cancer Maternal Grandmother 63  . Stroke Paternal Grandfather   . HIV Paternal Uncle   . Breast cancer Neg Hx    Ms. Blatchley has 2 daughters ages 98 and 21 with no history of cancer.  Ms Tillison has 1 maternal half-sister who is 99 with no history of cancer.   Ms. Montas' father is 61 and was diagnosed with prostate cancer at 37.  He had his prostate removed, but did not have radiation or any further therapy.  Ms. Zawistowski has a paternal uncle who died of HIV/AIDS.  She has a paternal cousin who is 33 with no history of cancer.  Ms. Italiano has a paternal aunt who is 56.  This aunt has a grandson who was diagnosed with a spinal cancer at 49 year of age and is now 19.  Ms. Chargois' paternal grandfather died in his 73's/80's after a stroke.  Her paternal grandmother died in her 52's/80's with no known history of cancer.  Ms. Jurczyk explains that her paternal family does not discuss health issues that much, so she is limited in her knowledge of their health.   Ms. Fennelly' mother was diagnosed with colon cancer in her 14's and is currently 68.  She had a hysterectomy in her early 31's, but Ms. Wyman does not know the reason.  Ms. Bondar has 1 maternal aunt who is 41 with no children or any history of cancer.  Ms. Stigler' maternal grandfather died at  87 with no history of cancer.  Her maternal grandmother was diagnosed with colon cancer in her 36's and died at 96.   Ms. Cade is unaware of previous family history of genetic testing for hereditary cancer risks. Patient's maternal ancestors are of Welsh/Northern European descent, and paternal ancestors are of Biochemist, clinical European descent. There is no reported Ashkenazi Jewish ancestry. There is no known consanguinity.  GENETIC TEST RESULTS: Genetic testing performed through [facility]'s Common  Hereditary Cancer Panel reported out on 01/07/2017 showed no pathogenic mutations. The Hereditary Gene Panel + Prostate Cancer Panel offered by [facility] includes sequencing and/or deletion duplication testing of the following 48 genes: APC, ATM, AXIN2, BARD1, BMPR1A, BRCA1, BRCA2, BRIP1, CDH1, CDKN2A (p14ARF), CDKN2A (p16INK4a), CKD4, CHEK2, CTNNA1, DICER1, EPCAM (Deletion/duplication testing only), FANCA, GREM1 (promoter region deletion/duplication testing only), KIT, MEN1, MLH1, MSH2, MSH3, MSH6, MUTYH, NBN, NF1, NHTL1, PALB2, PDGFRA, PMS2, POLD1, POLE, PTEN, RAD50, RAD51C, RAD51D, SDHB, SDHC, SDHD, SMAD4, SMARCA4. STK11, TP53, TSC1, TSC2, and VHL.  The following genes were evaluated for sequence changes only: SDHA and HOXB13 c.251G>A variant only.  The test report will be scanned into EPIC and will be located under the Molecular Pathology section of the Results Review tab.A portion of the result report is included below for reference.     We discussed with Ms. Macconnell that because current genetic testing is not perfect, it is possible there may be a gene mutation in one of these genes that current testing cannot detect, but that chance is small. We also discussed, that there could be another gene that has not yet been discovered, or that we have not yet tested, that is responsible for the cancer diagnoses in the family. Therefore, it is important to remain in touch with cancer genetics in the future so that we can continue to offer Ms. Stai the most up to date genetic testing.   ADDITIONAL GENETIC TESTING: We discussed with Ms. Jablonowski that there are other genes that are associated with increased cancer risk that can be analyzed. The laboratories that offer this testing look at these additional genes via a hereditary cancer gene panel. Should Ms. Mennenga wish to pursue additional genetic testing, we are happy to discuss and coordinate this testing, at any time.    CANCER SCREENING RECOMMENDATIONS: This  test result is negative (normal).  This means that we have not identified a hereditary cause for Ms. Labo' personal and family history of cancer at this time.   While reassuring, this does not definitively rule out a hereditary basis for her cancer. It is still possible that there could be genetic mutations that are undetectable by current technology, or genetic mutations in genes that have not been tested or identified to increase cancer risk.  Therefore, it is recommended she continue to follow the cancer management and screening guidelines provided by her oncology and primary healthcare provider. Other factors such as her personal and family history may still affect her cancer risk.  Based on Ms. Bassin' family history of colon cancer in her mother and maternal grandmother, she is still at a somewhat increased risk to develop colon cancer.  We recommend she start having colonoscopies at 69 and repeat every 5 years or as directed by a gastroenterologist.       RECOMMENDATIONS FOR FAMILY MEMBERS: Women in this family might be at some increased risk of developing cancer, over the general population risk, simply due to the family history of cancer. We recommended women in this family have a yearly  mammogram beginning at age 30, or 46 years younger than the earliest onset of cancer, an annual clinical breast exam, and perform monthly breast self-exams.   We recommend Ms. Widener' daughters start having annual mammograms starting by age 7.  They should inform their doctors about the family history of cancer so they can assess their individual risk and make appropriate individualized screening recommendations.   Women in this family should also have a gynecological exam as recommended by their primary provider. All family members should have a colonoscopy by age 53 (or earlier/more frequent depending on family history)  Ms. Baruch Merl maternal half-sister is also recommended to start colonoscopies at age 44  and repeat every 5 years or as directed by her gastroenterologist.   FOLLOW-UP: Lastly, we discussed with Ms. Brickman that cancer genetics is a rapidly advancing field and it is possible that new genetic tests will be appropriate for her and/or her family members in the future. We encouraged her to remain in contact with cancer genetics on an annual basis so we can update her personal and family histories and let her know of advances in cancer genetics that may benefit this family.   Our contact number was provided. Ms. Chestnutt questions were answered to her satisfaction, and she knows she is welcome to call us at anytime with additional questions or concerns.   Ferol Luz, MS Genetic Counselor [email or web address]

## 2017-01-10 NOTE — Telephone Encounter (Signed)
Revealed negative genetic testing.   This normal result indicates that it is unlikely Pamela Whitney's cancer is due to a hereditary cause.  However genetic testing is not perfect, and cannot definitively rule out a hereditary cause for her and her family's cancer. It will be important for her to keep in contact with genetics to learn if any additional testing may be needed in the future.    We did recommend having colonoscopies every 5 years starting at age 64 for her given the family history of colon cancer in her mother and grandmother.  She is still at an increased risk despite negative genetic testing.

## 2017-01-11 ENCOUNTER — Other Ambulatory Visit: Payer: Self-pay | Admitting: General Surgery

## 2017-01-11 ENCOUNTER — Encounter (HOSPITAL_BASED_OUTPATIENT_CLINIC_OR_DEPARTMENT_OTHER): Payer: Self-pay | Admitting: *Deleted

## 2017-01-11 ENCOUNTER — Other Ambulatory Visit: Payer: Self-pay

## 2017-01-11 DIAGNOSIS — C50012 Malignant neoplasm of nipple and areola, left female breast: Principal | ICD-10-CM

## 2017-01-11 DIAGNOSIS — C50011 Malignant neoplasm of nipple and areola, right female breast: Secondary | ICD-10-CM

## 2017-01-11 DIAGNOSIS — Z170 Estrogen receptor positive status [ER+]: Principal | ICD-10-CM

## 2017-01-16 ENCOUNTER — Ambulatory Visit
Admission: RE | Admit: 2017-01-16 | Discharge: 2017-01-16 | Disposition: A | Discharge status: Home / Self Care | Payer: 59 | Source: Ambulatory Visit | Attending: General Surgery | Admitting: General Surgery

## 2017-01-16 DIAGNOSIS — C50011 Malignant neoplasm of nipple and areola, right female breast: Secondary | ICD-10-CM

## 2017-01-16 DIAGNOSIS — Z170 Estrogen receptor positive status [ER+]: Principal | ICD-10-CM

## 2017-01-16 DIAGNOSIS — C50012 Malignant neoplasm of nipple and areola, left female breast: Principal | ICD-10-CM

## 2017-01-18 ENCOUNTER — Ambulatory Visit
Admission: RE | Admit: 2017-01-18 | Discharge: 2017-01-18 | Disposition: A | Discharge status: Home / Self Care | Payer: 59 | Source: Ambulatory Visit | Attending: General Surgery | Admitting: General Surgery

## 2017-01-18 ENCOUNTER — Ambulatory Visit (HOSPITAL_BASED_OUTPATIENT_CLINIC_OR_DEPARTMENT_OTHER)
Admission: RE | Admit: 2017-01-18 | Discharge: 2017-01-18 | Disposition: A | Discharge status: Home / Self Care | Payer: 59 | Source: Ambulatory Visit | Attending: General Surgery | Admitting: General Surgery

## 2017-01-18 ENCOUNTER — Ambulatory Visit (HOSPITAL_BASED_OUTPATIENT_CLINIC_OR_DEPARTMENT_OTHER): Payer: 59 | Admitting: Anesthesiology

## 2017-01-18 ENCOUNTER — Ambulatory Visit (HOSPITAL_COMMUNITY)
Admission: RE | Admit: 2017-01-18 | Discharge: 2017-01-18 | Disposition: A | Discharge status: Home / Self Care | Payer: 59 | Source: Ambulatory Visit | Attending: General Surgery | Admitting: General Surgery

## 2017-01-18 ENCOUNTER — Encounter (HOSPITAL_BASED_OUTPATIENT_CLINIC_OR_DEPARTMENT_OTHER)
Admission: RE | Disposition: A | Discharge status: Home / Self Care | Payer: Self-pay | Source: Ambulatory Visit | Attending: General Surgery

## 2017-01-18 ENCOUNTER — Other Ambulatory Visit: Payer: Self-pay

## 2017-01-18 ENCOUNTER — Encounter (HOSPITAL_BASED_OUTPATIENT_CLINIC_OR_DEPARTMENT_OTHER): Payer: Self-pay | Admitting: Anesthesiology

## 2017-01-18 DIAGNOSIS — C50011 Malignant neoplasm of nipple and areola, right female breast: Secondary | ICD-10-CM

## 2017-01-18 DIAGNOSIS — C50012 Malignant neoplasm of nipple and areola, left female breast: Principal | ICD-10-CM

## 2017-01-18 DIAGNOSIS — N6022 Fibroadenosis of left breast: Secondary | ICD-10-CM | POA: Insufficient documentation

## 2017-01-18 DIAGNOSIS — Z170 Estrogen receptor positive status [ER+]: Principal | ICD-10-CM

## 2017-01-18 DIAGNOSIS — Z791 Long term (current) use of non-steroidal anti-inflammatories (NSAID): Secondary | ICD-10-CM | POA: Insufficient documentation

## 2017-01-18 DIAGNOSIS — C50911 Malignant neoplasm of unspecified site of right female breast: Secondary | ICD-10-CM | POA: Diagnosis not present

## 2017-01-18 DIAGNOSIS — C50912 Malignant neoplasm of unspecified site of left female breast: Secondary | ICD-10-CM | POA: Insufficient documentation

## 2017-01-18 DIAGNOSIS — Z87891 Personal history of nicotine dependence: Secondary | ICD-10-CM | POA: Diagnosis not present

## 2017-01-18 HISTORY — PX: RADIOACTIVE SEED GUIDED PARTIAL MASTECTOMY WITH AXILLARY SENTINEL LYMPH NODE BIOPSY: SHX6520

## 2017-01-18 SURGERY — RADIOACTIVE SEED GUIDED PARTIAL MASTECTOMY WITH AXILLARY SENTINEL LYMPH NODE BIOPSY
Anesthesia: General | Site: Breast | Laterality: Bilateral

## 2017-01-18 MED ORDER — MIDAZOLAM HCL 2 MG/2ML IJ SOLN
INTRAMUSCULAR | Status: AC
  Filled 2017-01-18: qty 2

## 2017-01-18 MED ORDER — PHENYLEPHRINE HCL 10 MG/ML IJ SOLN
INTRAMUSCULAR | Status: DC | PRN
  Administered 2017-01-18: 80 ug via INTRAVENOUS

## 2017-01-18 MED ORDER — SCOPOLAMINE 1 MG/3DAYS TD PT72: 1.0000 | MEDICATED_PATCH | Freq: Once | TRANSDERMAL | Status: DC | PRN

## 2017-01-18 MED ORDER — ACETAMINOPHEN 500 MG PO TABS
1000.0000 mg | ORAL_TABLET | ORAL | Status: AC
  Administered 2017-01-18: 1000 mg via ORAL

## 2017-01-18 MED ORDER — SODIUM CHLORIDE 0.9 % IJ SOLN
INTRAMUSCULAR | Status: AC
  Filled 2017-01-18: qty 10

## 2017-01-18 MED ORDER — CIPROFLOXACIN IN D5W 400 MG/200ML IV SOLN: 400.0000 mg | INTRAVENOUS | Status: DC

## 2017-01-18 MED ORDER — BUPIVACAINE HCL (PF) 0.25 % IJ SOLN
INTRAMUSCULAR | Status: DC | PRN
  Administered 2017-01-18: 12 mL

## 2017-01-18 MED ORDER — ONDANSETRON HCL 4 MG/2ML IJ SOLN
INTRAMUSCULAR | Status: DC | PRN
  Administered 2017-01-18: 4 mg via INTRAVENOUS

## 2017-01-18 MED ORDER — TECHNETIUM TC 99M SULFUR COLLOID FILTERED
1.0000 | Freq: Once | INTRAVENOUS | Status: AC | PRN
  Administered 2017-01-18: 1 via INTRADERMAL

## 2017-01-18 MED ORDER — CELECOXIB 200 MG PO CAPS
ORAL_CAPSULE | ORAL | Status: AC
  Filled 2017-01-18: qty 1

## 2017-01-18 MED ORDER — METHYLENE BLUE 0.5 % INJ SOLN
INTRAVENOUS | Status: AC
  Filled 2017-01-18: qty 10

## 2017-01-18 MED ORDER — KETOROLAC TROMETHAMINE 30 MG/ML IJ SOLN
INTRAMUSCULAR | Status: AC
  Filled 2017-01-18: qty 1

## 2017-01-18 MED ORDER — ONDANSETRON HCL 4 MG/2ML IJ SOLN
INTRAMUSCULAR | Status: AC
  Filled 2017-01-18: qty 2

## 2017-01-18 MED ORDER — CIPROFLOXACIN IN D5W 400 MG/200ML IV SOLN
400.0000 mg | INTRAVENOUS | Status: AC
  Administered 2017-01-18: 400 mg via INTRAVENOUS

## 2017-01-18 MED ORDER — ACETAMINOPHEN 500 MG PO TABS
ORAL_TABLET | ORAL | Status: AC
  Filled 2017-01-18: qty 2

## 2017-01-18 MED ORDER — EPHEDRINE SULFATE 50 MG/ML IJ SOLN
INTRAMUSCULAR | Status: DC | PRN
  Administered 2017-01-18: 10 mg via INTRAVENOUS

## 2017-01-18 MED ORDER — LIDOCAINE 2% (20 MG/ML) 5 ML SYRINGE
INTRAMUSCULAR | Status: AC
  Filled 2017-01-18: qty 5

## 2017-01-18 MED ORDER — FENTANYL CITRATE (PF) 100 MCG/2ML IJ SOLN
50.0000 ug | INTRAMUSCULAR | Status: AC | PRN
  Administered 2017-01-18: 25 ug via INTRAVENOUS
  Administered 2017-01-18 (×3): 50 ug via INTRAVENOUS

## 2017-01-18 MED ORDER — PROPOFOL 500 MG/50ML IV EMUL
INTRAVENOUS | Status: AC
  Filled 2017-01-18: qty 50

## 2017-01-18 MED ORDER — BUPIVACAINE-EPINEPHRINE (PF) 0.25% -1:200000 IJ SOLN
INTRAMUSCULAR | Status: DC | PRN
  Administered 2017-01-18: 30 mL via PERINEURAL
  Administered 2017-01-18: 30 mL

## 2017-01-18 MED ORDER — EPHEDRINE 5 MG/ML INJ
INTRAVENOUS | Status: AC
  Filled 2017-01-18: qty 10

## 2017-01-18 MED ORDER — GABAPENTIN 300 MG PO CAPS
300.0000 mg | ORAL_CAPSULE | ORAL | Status: AC
  Administered 2017-01-18: 300 mg via ORAL

## 2017-01-18 MED ORDER — GABAPENTIN 300 MG PO CAPS
ORAL_CAPSULE | ORAL | Status: AC
  Filled 2017-01-18: qty 1

## 2017-01-18 MED ORDER — CIPROFLOXACIN IN D5W 400 MG/200ML IV SOLN
INTRAVENOUS | Status: AC
  Filled 2017-01-18: qty 200

## 2017-01-18 MED ORDER — CELECOXIB 200 MG PO CAPS
200.0000 mg | ORAL_CAPSULE | ORAL | Status: AC
  Administered 2017-01-18: 200 mg via ORAL

## 2017-01-18 MED ORDER — HYDROCODONE-ACETAMINOPHEN 7.5-325 MG PO TABS: 1.0000 | ORAL_TABLET | Freq: Once | ORAL | Status: DC | PRN

## 2017-01-18 MED ORDER — LACTATED RINGERS IV SOLN
INTRAVENOUS | Status: DC
  Administered 2017-01-18: 09:00:00 via INTRAVENOUS

## 2017-01-18 MED ORDER — HYDROMORPHONE HCL 1 MG/ML IJ SOLN: 0.2500 mg | INTRAMUSCULAR | Status: DC | PRN

## 2017-01-18 MED ORDER — DEXAMETHASONE SODIUM PHOSPHATE 4 MG/ML IJ SOLN
INTRAMUSCULAR | Status: DC | PRN
  Administered 2017-01-18: 10 mg via INTRAVENOUS

## 2017-01-18 MED ORDER — MIDAZOLAM HCL 2 MG/2ML IJ SOLN
1.0000 mg | INTRAMUSCULAR | Status: DC | PRN
  Administered 2017-01-18 (×2): 2 mg via INTRAVENOUS

## 2017-01-18 MED ORDER — DEXAMETHASONE SODIUM PHOSPHATE 10 MG/ML IJ SOLN
INTRAMUSCULAR | Status: AC
  Filled 2017-01-18: qty 1

## 2017-01-18 MED ORDER — FENTANYL CITRATE (PF) 100 MCG/2ML IJ SOLN
INTRAMUSCULAR | Status: AC
  Filled 2017-01-18: qty 2

## 2017-01-18 MED ORDER — OXYCODONE HCL 5 MG PO TABS: 5.0000 mg | ORAL_TABLET | Freq: Four times a day (QID) | ORAL | 0 refills | Status: DC | PRN

## 2017-01-18 MED ORDER — PROMETHAZINE HCL 25 MG/ML IJ SOLN: 6.2500 mg | INTRAMUSCULAR | Status: DC | PRN

## 2017-01-18 MED ORDER — MEPERIDINE HCL 25 MG/ML IJ SOLN: 6.2500 mg | INTRAMUSCULAR | Status: DC | PRN

## 2017-01-18 SURGICAL SUPPLY — 58 items
ADH SKN CLS APL DERMABOND .7 (GAUZE/BANDAGES/DRESSINGS) ×2
APPLIER CLIP 9.375 MED OPEN (MISCELLANEOUS) ×3
APR CLP MED 9.3 20 MLT OPN (MISCELLANEOUS) ×1
BINDER BREAST LRG (GAUZE/BANDAGES/DRESSINGS) ×2 IMPLANT
BLADE SURG 15 STRL LF DISP TIS (BLADE) ×1 IMPLANT
BLADE SURG 15 STRL SS (BLADE) ×3
CANISTER SUCT 1200ML W/VALVE (MISCELLANEOUS) ×2 IMPLANT
CHLORAPREP W/TINT 26ML (MISCELLANEOUS) ×3 IMPLANT
CLIP APPLIE 9.375 MED OPEN (MISCELLANEOUS) IMPLANT
CLIP VESOCCLUDE SM WIDE 6/CT (CLIP) ×3 IMPLANT
CLOSURE WOUND 1/2 X4 (GAUZE/BANDAGES/DRESSINGS) ×1
COVER BACK TABLE 60X90IN (DRAPES) ×3 IMPLANT
COVER MAYO STAND STRL (DRAPES) ×3 IMPLANT
COVER PROBE W GEL 5X96 (DRAPES) ×3 IMPLANT
DERMABOND ADVANCED (GAUZE/BANDAGES/DRESSINGS) ×4
DERMABOND ADVANCED .7 DNX12 (GAUZE/BANDAGES/DRESSINGS) ×1 IMPLANT
DEVICE DUBIN W/COMP PLATE 8390 (MISCELLANEOUS) ×3 IMPLANT
DRAPE LAPAROSCOPIC ABDOMINAL (DRAPES) ×3 IMPLANT
DRAPE UTILITY XL STRL (DRAPES) ×3 IMPLANT
ELECT COATED BLADE 2.86 ST (ELECTRODE) ×3 IMPLANT
ELECT REM PT RETURN 9FT ADLT (ELECTROSURGICAL) ×3
ELECTRODE REM PT RTRN 9FT ADLT (ELECTROSURGICAL) ×1 IMPLANT
GLOVE BIO SURGEON STRL SZ 6.5 (GLOVE) ×1 IMPLANT
GLOVE BIO SURGEON STRL SZ7 (GLOVE) ×8 IMPLANT
GLOVE BIO SURGEONS STRL SZ 6.5 (GLOVE) ×1
GLOVE BIOGEL PI IND STRL 6.5 (GLOVE) IMPLANT
GLOVE BIOGEL PI IND STRL 7.5 (GLOVE) ×1 IMPLANT
GLOVE BIOGEL PI INDICATOR 6.5 (GLOVE) ×2
GLOVE BIOGEL PI INDICATOR 7.5 (GLOVE) ×2
GOWN STRL REUS W/ TWL LRG LVL3 (GOWN DISPOSABLE) ×2 IMPLANT
GOWN STRL REUS W/TWL LRG LVL3 (GOWN DISPOSABLE) ×6
HEMOSTAT ARISTA ABSORB 3G PWDR (MISCELLANEOUS) ×2 IMPLANT
KIT MARKER MARGIN INK (KITS) ×3 IMPLANT
NDL HYPO 25X1 1.5 SAFETY (NEEDLE) ×1 IMPLANT
NDL SAFETY ECLIPSE 18X1.5 (NEEDLE) IMPLANT
NEEDLE HYPO 18GX1.5 SHARP (NEEDLE)
NEEDLE HYPO 25X1 1.5 SAFETY (NEEDLE) ×3 IMPLANT
NS IRRIG 1000ML POUR BTL (IV SOLUTION) IMPLANT
PACK BASIN DAY SURGERY FS (CUSTOM PROCEDURE TRAY) ×3 IMPLANT
PENCIL BUTTON HOLSTER BLD 10FT (ELECTRODE) ×3 IMPLANT
SLEEVE SCD COMPRESS KNEE MED (MISCELLANEOUS) ×3 IMPLANT
SPONGE LAP 4X18 X RAY DECT (DISPOSABLE) ×3 IMPLANT
STRIP CLOSURE SKIN 1/2X4 (GAUZE/BANDAGES/DRESSINGS) ×2 IMPLANT
SUT ETHILON 2 0 FS 18 (SUTURE) IMPLANT
SUT MNCRL AB 4-0 PS2 18 (SUTURE) ×7 IMPLANT
SUT MON AB 5-0 PS2 18 (SUTURE) ×8 IMPLANT
SUT SILK 2 0 SH (SUTURE) ×2 IMPLANT
SUT VIC AB 2-0 SH 27 (SUTURE) ×9
SUT VIC AB 2-0 SH 27XBRD (SUTURE) ×1 IMPLANT
SUT VIC AB 3-0 SH 27 (SUTURE) ×9
SUT VIC AB 3-0 SH 27X BRD (SUTURE) ×1 IMPLANT
SUT VIC AB 5-0 PS2 18 (SUTURE) IMPLANT
SYR CONTROL 10ML LL (SYRINGE) ×3 IMPLANT
TOWEL OR 17X24 6PK STRL BLUE (TOWEL DISPOSABLE) ×3 IMPLANT
TOWEL OR NON WOVEN STRL DISP B (DISPOSABLE) ×3 IMPLANT
TUBE CONNECTING 20'X1/4 (TUBING) ×1
TUBE CONNECTING 20X1/4 (TUBING) ×1 IMPLANT
YANKAUER SUCT BULB TIP NO VENT (SUCTIONS) ×2 IMPLANT

## 2017-01-18 NOTE — Anesthesia Procedure Notes (Signed)
Anesthesia Regional Block: Pectoralis block   Pre-Anesthetic Checklist: ,, timeout performed, Correct Patient, Correct Site, Correct Laterality, Correct Procedure, Correct Position, site marked, Risks and benefits discussed,  Surgical consent,  Pre-op evaluation,  At surgeon's request and post-op pain management  Laterality: Right  Prep: chloraprep       Needles:  Injection technique: Single-shot  Needle Type: Echogenic Stimulator Needle     Needle Length: 9cm  Needle Gauge: 21   Needle insertion depth: 5.5 cm   Additional Needles:   Procedures:,,,, ultrasound used (permanent image in chart),,,,  Narrative:  Start time: 01/18/2017 9:00 AM End time: 01/18/2017 9:05 AM Injection made incrementally with aspirations every 5 mL.  Performed by: Personally  Anesthesiologist: Josephine Igo, MD  Additional Notes: Relevant anatomy ID'd using [place]. Incremental 2-79ml injection of LA with frequent aspiration. Patient tolerated procedure well.

## 2017-01-18 NOTE — Anesthesia Postprocedure Evaluation (Signed)
Anesthesia Post Note  Patient: Pamela Whitney  Procedure(s) Performed: BILATERAL BREAST LUMPECTOMY WITH  BILATERAL RADIOACTIVE SEED AND BILATERAL SENTINEL LYMPH NODE BIOPSY (Bilateral Breast)     Patient location during evaluation: PACU Anesthesia Type: General Level of consciousness: awake and alert Pain management: pain level controlled Vital Signs Assessment: post-procedure vital signs reviewed and stable Respiratory status: spontaneous breathing, nonlabored ventilation and respiratory function stable Cardiovascular status: blood pressure returned to baseline and stable Postop Assessment: no apparent nausea or vomiting Anesthetic complications: no    Last Vitals:  Vitals:   01/18/17 1230 01/18/17 1245  BP: 113/74 112/77  Pulse: 93 89  Resp: 16 16  Temp:    SpO2: 97% 99%    Last Pain:  Vitals:   01/18/17 1245  TempSrc:   PainSc: 0-No pain                 Gradie Ohm A.

## 2017-01-18 NOTE — Addendum Note (Signed)
Addendum  created 01/18/17 1415 by Terrance Mass, CRNA   Charge Capture section accepted

## 2017-01-18 NOTE — Interval H&P Note (Signed)
History and Physical Interval Note:  01/18/2017 10:05 AM  Pamela Whitney  has presented today for surgery, with the diagnosis of BILATERAL BREAST CANCER  The various methods of treatment have been discussed with the patient and family. After consideration of risks, benefits and other options for treatment, the patient has consented to  Procedure(s): BILATERAL BREAST LUMPECTOMY WITH  BILATERAL RADIOACTIVE SEED AND BILATERAL SENTINEL LYMPH NODE BIOPSY (Bilateral) as a surgical intervention .  The patient's history has been reviewed, patient examined, no change in status, stable for surgery.  I have reviewed the patient's chart and labs.  Questions were answered to the patient's satisfaction.     Rolm Bookbinder

## 2017-01-18 NOTE — Anesthesia Procedure Notes (Signed)
Anesthesia Regional Block: Pectoralis block   Pre-Anesthetic Checklist: ,, timeout performed, Correct Patient, Correct Site, Correct Laterality, Correct Procedure, Correct Position, site marked, Risks and benefits discussed,  Surgical consent,  Pre-op evaluation,  At surgeon's request and post-op pain management  Laterality: Left  Prep: chloraprep       Needles:  Injection technique: Single-shot  Needle Type: Echogenic Stimulator Needle     Needle Length: 9cm  Needle Gauge: 21   Needle insertion depth: 6 cm   Additional Needles:   Procedures:,,,, ultrasound used (permanent image in chart),,,,  Narrative:  Start time: 01/18/2017 8:50 AM End time: 01/18/2017 8:55 AM Injection made incrementally with aspirations every 5 mL.  Performed by: Personally  Anesthesiologist: Josephine Igo, MD  Additional Notes: Timeout performed. Patient sedated. Relevant anatomy ID'd using [place]. Incremental 2-88ml injection of LA with frequent aspiration. Patient tolerated procedure well.

## 2017-01-18 NOTE — Op Note (Signed)
Preoperative diagnosis: Rightbreast cancer, clinical stage I Left breast cancer clinical stage I Postoperative diagnosis: same as above Procedure:  Right breast seed guided lumpectomy Right deep axillary sentinel node biopsy Left breast seed guided lumpectomy Left deep axillary sentinel node biopsy Surgeon: Dr Serita Grammes [id number] Anes: general  Specimens  1.Left breast tissue marked with paint 2.Leftaxillary sentinel nodes with highest [id number] 3. Additional left breast inferior and deep margins marked short superior, long lateral, double deep 4. Right breast tissue marked with paint 5. Additional right breast posterolateral margin marked short superior, long lateral, double deep 6. right axillary sentinel nodes with highest count 0981 Complications none Drains none Sponge count correct Dispo to pacu stable  Indications: This is a50yof with a newly diagnosed clinical stage Ibilateral breast cancers. We discussed options and have elected to proceed with bilateral seed guided lumpectomies and sentinel node biopsies.  Her genetic testing was negative.    Procedure: After informed consent was obtained the patient was taken to the operating room. She first was given technetium in standard periareolar fashion bilaterally. She had a pectoral block bilaterally. She was given antibiotics. Sequential compression devices were on her legs. She was then placed under general anesthesia with an LMA. Then she was prepped and draped in the standard sterile surgical fashion. Surgical timeout was then performed.  I then located the seed in thelower central left breast.I infiltrated marcaine in the skin and then made and incision around the areola to hide the scar. I then used the neoprobe to remove the seed and the surrounding tissue with attempt to get clear margins. I marked this with paint. MM confirmed removal of seed and theclip.I placed clips in the cavity. I did remove  additional deep and inferior margins after discussing with pathology.  The deep margin is the muscle. I then obtained hemostasis.  This was marked as above.I approximated the breast tissue with 2-0 vicryl. The skin was closed with 3-0 vicryl and 5-0 monocryl. Glue and steristrips were applied. I then infiltrated marcaine in the low axilla and made an incision below the hairline.Icarried this through the axillary fascia.I located the radioactive nodes and excised them with highest count as above.  There were no enlarged nodes. The background radioactivity was minimal. I then obtained hemostasis. I closed the axillary fascia with 2-0 vicryl.The skin was closed with 3-0 vicryl and 4-0 monocryl. Glue and steristrips were applied. I then went to the right side. I then located the seed in thelateral right breastI infiltrated marcaine in the skin and then made an incision around the areola to hide the scar. I tunneled to the lesion laterally. I then used the neoprobe to remove the seed and the surrounding tissue with attempt to get clear margins. I marked this with paint. MM confirmed removal of seed and theclip.I removed additional posterolateral margin as this appeared close to pathology. I placed clips in the cavity.I then obtained hemostasis.   This was marked as above.I approximated the breast tissue with 2-0 vicryl. The skin was closed with 3-0 vicryl and 5-0 monocryl. Glue and steristrips were applied. I then infiltrated marcaine in the low axilla and made an incision below the hairline.Icarried this through the axillary fascia. I located the radioactive nodes and excised them with highest count as above.  There were no enlarged nodes. The background radioactivity was minimal.There were no enlarged nodes. The background radioactivity was zero. I then obtained hemostasis. I closed the axillary fascia with 2-0 vicryl.The skin was closed with 3-0  vicryl and 4-0 monocryl. Glue  and steristrips were applied. A binder was placed. She was extubated and transferred to PACU.

## 2017-01-18 NOTE — OR Nursing (Signed)
#  4 and #7 margins labeled incorrectly--should be #4 Right breast Posterolateral margin and #7 Left breast additional inferior margin.  Spoke with Arman Bogus in pathology to correct specimens and she states that she will correct them on requisitions and for final report.

## 2017-01-18 NOTE — Discharge Instructions (Signed)
[facility] °Office Phone Number [phone] ° °POST OP INSTRUCTIONS ° °Always review your discharge instruction sheet given to you by the facility where your surgery was performed. ° °IF YOU HAVE DISABILITY OR FAMILY LEAVE FORMS, YOU MUST BRING THEM TO THE OFFICE FOR PROCESSING.  DO NOT GIVE THEM TO YOUR DOCTOR. ° °1. A prescription for pain medication may be given to you upon discharge.  Take your pain medication as prescribed, if needed.  If narcotic pain medicine is not needed, then you may take acetaminophen (Tylenol), naprosyn (Alleve) or ibuprofen (Advil) as needed. °2. Take your usually prescribed medications unless otherwise directed °3. If you need a refill on your pain medication, please contact your pharmacy.  They will contact our office to request authorization.  Prescriptions will not be filled after 5pm or on week-ends. °4. You should eat very light the first 24 hours after surgery, such as soup, crackers, pudding, etc.  Resume your normal diet the day after surgery. °5. Most patients will experience some swelling and bruising in the breast.  Ice packs and a good support bra will help.  Wear the breast binder provided or a sports bra for 72 hours day and night.  After that wear a sports bra during the day until you return to the office. Swelling and bruising can take several days to resolve.  °6. It is common to experience some constipation if taking pain medication after surgery.  Increasing fluid intake and taking a stool softener will usually help or prevent this problem from occurring.  A mild laxative (Milk of Magnesia or Miralax) should be taken according to package directions if there are no bowel movements after 48 hours. °7. Unless discharge instructions indicate otherwise, you may remove your bandages 48 hours after surgery and you may shower at that time.  You may have steri-strips (small skin tapes) in place directly over the incision.  These strips should be left on the  skin for 7-10 days and will come off on their own.  If your surgeon used skin glue on the incision, you may shower in 24 hours.  The glue will flake off over the next 2-3 weeks.  Any sutures or staples will be removed at the office during your follow-up visit. °8. ACTIVITIES:  You may resume regular daily activities (gradually increasing) beginning the next day.  Wearing a good support bra or sports bra minimizes pain and swelling.  You may have sexual intercourse when it is comfortable. °a. You may drive when you no longer are taking prescription pain medication, you can comfortably wear a seatbelt, and you can safely maneuver your car and apply brakes. °b. RETURN TO WORK:  ______________________________________________________________________________________ °9. You should see your doctor in the office for a follow-up appointment approximately two weeks after your surgery.  Your doctor’s nurse will typically make your follow-up appointment when she calls you with your pathology report.  Expect your pathology report 3-4 business days after your surgery.  You may call to check if you do not hear from us after three days. °10. OTHER INSTRUCTIONS: _______________________________________________________________________________________________ _____________________________________________________________________________________________________________________________________ °_____________________________________________________________________________________________________________________________________ °_____________________________________________________________________________________________________________________________________ ° °WHEN TO CALL DR WAKEFIELD: °1. Fever over 101.0 °2. Nausea and/or vomiting. °3. Extreme swelling or bruising. °4. Continued bleeding from incision. °5. Increased pain, redness, or drainage from the incision. ° °The clinic staff is available to answer your questions during regular  business hours.  Please don’t hesitate to call and ask to speak to one of the nurses for clinical concerns.  If   you have a medical emergency, go to the nearest emergency room or call 911.  A surgeon from [facility] is always on call at the hospital. ° °For further questions, please visit [email or web address] ° ° ° ° ° °Post Anesthesia Home Care Instructions ° °Activity: °Get plenty of rest for the remainder of the day. A responsible individual must stay with you for 24 hours following the procedure.  °For the next 24 hours, DO NOT: °-Drive a car °-Operate machinery °-Drink alcoholic beverages °-Take any medication unless instructed by your physician °-Make any legal decisions or sign important papers. ° °Meals: °Start with liquid foods such as gelatin or soup. Progress to regular foods as tolerated. Avoid greasy, spicy, heavy foods. If nausea and/or vomiting occur, drink only clear liquids until the nausea and/or vomiting subsides. Call your physician if vomiting continues. ° °Special Instructions/Symptoms: °Your throat may feel dry or sore from the anesthesia or the breathing tube placed in your throat during surgery. If this causes discomfort, gargle with warm salt water. The discomfort should disappear within 24 hours. ° °If you had a scopolamine patch placed behind your ear for the management of post- operative nausea and/or vomiting: ° °1. The medication in the patch is effective for 72 hours, after which it should be removed.  Wrap patch in a tissue and discard in the trash. Wash hands thoroughly with soap and water. °2. You may remove the patch earlier than 72 hours if you experience unpleasant side effects which may include dry mouth, dizziness or visual disturbances. °3. Avoid touching the patch. Wash your hands with soap and water after contact with the patch. °  ° °

## 2017-01-18 NOTE — Anesthesia Procedure Notes (Signed)
Procedure Name: LMA Insertion Performed by: Leontyne Manville W, CRNA Pre-anesthesia Checklist: Patient identified, Emergency Drugs available, Suction available and Patient being monitored Patient Re-evaluated:Patient Re-evaluated prior to induction Oxygen Delivery Method: Circle system utilized Preoxygenation: Pre-oxygenation with 100% oxygen Induction Type: IV induction Ventilation: Mask ventilation without difficulty LMA: LMA inserted LMA Size: 4.0 Number of attempts: 1 Placement Confirmation: positive ETCO2 Tube secured with: Tape Dental Injury: Teeth and Oropharynx as per pre-operative assessment        

## 2017-01-18 NOTE — Progress Notes (Signed)
Assisted Dr. Royce Macadamia with right and left, ultrasound guided, pectoralis blocks. Side rails up, monitors on throughout procedure. See vital signs in flow sheet. Tolerated Procedure well.

## 2017-01-18 NOTE — Transfer of Care (Signed)
Immediate Anesthesia Transfer of Care Note  Patient: Pamela Whitney  Procedure(s) Performed: BILATERAL BREAST LUMPECTOMY WITH  BILATERAL RADIOACTIVE SEED AND BILATERAL SENTINEL LYMPH NODE BIOPSY (Bilateral Breast)  Patient Location: PACU  Anesthesia Type:General  Level of Consciousness: awake, alert  and oriented  Airway & Oxygen Therapy: Patient Spontanous Breathing and Patient connected to face mask oxygen  Post-op Assessment: Report given to RN and Post -op Vital signs reviewed and stable  Post vital signs: Reviewed and stable  Last Vitals:  Vitals:   01/18/17 1000 01/18/17 1210  BP: 116/86 104/70  Pulse: 93   Resp: 19   Temp:    SpO2: 100%     Last Pain:  Vitals:   01/18/17 0820  TempSrc: Oral         Complications: No apparent anesthesia complications

## 2017-01-18 NOTE — H&P (Signed)
51 yof who is referred by Dr Freda Munro for newly diagnosed left breast cancer. she has history of adh excised by one of my partners in 2007. she has no family history of breast or ovarian cancer. she has no mass or dc. she underwent screening mm with dense breasts. she was noted to have a left breast mass that on [place] measures 9x7x23mm. her axillary [place] is negative. on biopsy this is a grade I idc that is er/pr positive, her 2 negative and Ki is 5%. she has undergone genetic testing and it is negative. mri was done that shows d density breasts. the left breast has biopsy proven malignancy that is a 1x0.5x1.2 cm mass. no abnl nodes. there was a subcentimeter right breast mass that has now undergone biopsy and is a grade I ILC that is 40% er pos, 50% pr pos, her 2 pending and Ki is 5%. she returns today to discuss options.  Past Surgical History Tawni Pummel, RN; 12/26/2016 7:37 AM) Breast Biopsy  Left. Cesarean Section - Multiple   Diagnostic Studies History Tawni Pummel, RN; 12/26/2016 7:37 AM) Colonoscopy  never Mammogram  within last year Pap Smear  1-5 years ago  Medication History Tawni Pummel, RN; 12/26/2016 7:37 AM) Medications Reconciled  Social History Tawni Pummel, RN; 12/26/2016 7:37 AM) Alcohol use  Occasional alcohol use. Caffeine use  Coffee. Illicit drug use  Remotely quit drug use. Tobacco use  Former smoker.  Family History Tawni Pummel, RN; 12/26/2016 7:37 AM) Bleeding disorder  Family Members In General. Cerebrovascular Accident  Family Members In General. Colon Cancer  Family Members In General, Mother. Diabetes Mellitus  Father. Hypertension  Father, Mother. Thyroid problems  Mother.  Pregnancy / Birth History Tawni Pummel, RN; 12/26/2016 7:37 AM) Age at menarche  42 years. Contraceptive History  Oral contraceptives. Gravida  2 Irregular periods  Maternal age  45-35 Para  2  Review of Systems Tawni Pummel  RN; 12/26/2016 7:37 AM) General Present- Night Sweats and Weight Gain. Not Present- Appetite Loss, Chills, Fatigue, Fever and Weight Loss. Skin Not Present- Change in Wart/Mole, Dryness, Hives, Jaundice, New Lesions, Non-Healing Wounds, Rash and Ulcer. HEENT Present- Wears glasses/contact lenses. Not Present- Earache, Hearing Loss, Hoarseness, Nose Bleed, Oral Ulcers, Ringing in the Ears, Seasonal Allergies, Sinus Pain, Sore Throat, Visual Disturbances and Yellow Eyes. Breast Present- Breast Mass. Not Present- Breast Pain, Nipple Discharge and Skin Changes. Cardiovascular Present- Swelling of Extremities. Not Present- Chest Pain, Difficulty Breathing Lying Down, Leg Cramps, Palpitations, Rapid Heart Rate and Shortness of Breath. Gastrointestinal Present- Gets full quickly at meals. Not Present- Abdominal Pain, Bloating, Bloody Stool, Change in Bowel Habits, Chronic diarrhea, Constipation, Difficulty Swallowing, Excessive gas, Hemorrhoids, Indigestion, Nausea, Rectal Pain and Vomiting. Female Genitourinary Not Present- Frequency, Nocturia, Painful Urination, Pelvic Pain and Urgency. Musculoskeletal Not Present- Back Pain, Joint Pain, Joint Stiffness, Muscle Pain, Muscle Weakness and Swelling of Extremities. Neurological Not Present- Decreased Memory, Fainting, Headaches, Numbness, Seizures, Tingling, Tremor, Trouble walking and Weakness. Psychiatric Not Present- Anxiety, Bipolar, Change in Sleep Pattern, Depression, Fearful and Frequent crying. Endocrine Not Present- Cold Intolerance, Excessive Hunger, Hair Changes, Heat Intolerance, Hot flashes and New Diabetes. Hematology Not Present- Easy Bruising, Excessive bleeding, Gland problems, HIV and Persistent Infections.   Physical Exam Rolm Bookbinder MD; 12/26/2016 10:01 AM) General Mental Status-Alert. Orientation-Oriented X3. Head and Neck Trachea-midline. Thyroid Gland Characteristics - normal size and consistency. Chest and Lung  Exam Chest and lung exam reveals -quiet, even and easy respiratory effort with no use  of accessory muscles and on auscultation, normal breath sounds, no adventitious sounds and normal vocal resonance Breast Nipples-No Discharge. Breast Lump-No Palpable Breast Mass. Cardiovascular Cardiovascular examination reveals -normal heart sounds, regular rate and rhythm with no murmurs. Lymphatic Head & Neck -Note: ? enlarged left cervical node. Axillary General Axillary Region: Bilateral - Description - Normal.   BILATERAL BREAST CANCER (C50.911) Story: Bilateral breast seed guided lumpectomies , bilateral axillary sentinel node biopsies we discussed new finding and pathology report. I dont think she needs mastectomies and she does not desire them either with negative genetics. we are going to proceed with bilateral bct with sn biopsy. will setup soon

## 2017-01-18 NOTE — Progress Notes (Signed)
Nuc med staff performed nuc med injections bilaterally with additional fentanyl given for sedation/comfort. Pt tol well. Will call family to bedside and update/provide emotional support

## 2017-01-18 NOTE — Anesthesia Preprocedure Evaluation (Signed)
Anesthesia Evaluation  Patient identified by MRN, date of birth, ID band Patient awake    Reviewed: Allergy & Precautions, NPO status , Patient's Chart, lab work & pertinent test results  Airway Mallampati: II  TM Distance: >3 FB Neck ROM: Full    Dental no notable dental hx. (+) Teeth Intact   Pulmonary former smoker,    Pulmonary exam normal breath sounds clear to auscultation       Cardiovascular negative cardio ROS Normal cardiovascular exam Rhythm:Regular Rate:Normal     Neuro/Psych negative neurological ROS  negative psych ROS   GI/Hepatic negative GI ROS, Neg liver ROS,   Endo/Other  Bilateral breast mass  Renal/GU negative Renal ROS  negative genitourinary   Musculoskeletal negative musculoskeletal ROS (+)   Abdominal   Peds  Hematology negative hematology ROS (+)   Anesthesia Other Findings   Reproductive/Obstetrics                             Anesthesia Physical Anesthesia Plan  ASA: II  Anesthesia Plan: General   Post-op Pain Management:    Induction: Intravenous  PONV Risk Score and Plan: 3 and Ondansetron, Dexamethasone, Scopolamine patch - Pre-op and Treatment may vary due to age or medical condition  Airway Management Planned: LMA  Additional Equipment:   Intra-op Plan:   Post-operative Plan: Extubation in OR  Informed Consent: I have reviewed the patients History and Physical, chart, labs and discussed the procedure including the risks, benefits and alternatives for the proposed anesthesia with the patient or authorized representative who has indicated his/her understanding and acceptance.   Dental advisory given  Plan Discussed with: CRNA, Anesthesiologist and Surgeon  Anesthesia Plan Comments:         Anesthesia Quick Evaluation

## 2017-01-21 ENCOUNTER — Encounter (HOSPITAL_BASED_OUTPATIENT_CLINIC_OR_DEPARTMENT_OTHER): Payer: Self-pay | Admitting: General Surgery

## 2017-01-25 ENCOUNTER — Telehealth: Payer: Self-pay | Admitting: *Deleted

## 2017-01-25 NOTE — Telephone Encounter (Signed)
Ordered oncotype per Dr. Feng.  Faxed requisition to pathology and confirmed receipt.  

## 2017-02-06 ENCOUNTER — Encounter (HOSPITAL_COMMUNITY): Payer: Self-pay

## 2017-02-07 ENCOUNTER — Telehealth: Payer: Self-pay | Admitting: *Deleted

## 2017-02-07 DIAGNOSIS — C50312 Malignant neoplasm of lower-inner quadrant of left female breast: Secondary | ICD-10-CM

## 2017-02-07 DIAGNOSIS — Z170 Estrogen receptor positive status [ER+]: Principal | ICD-10-CM

## 2017-02-07 NOTE — Telephone Encounter (Signed)
Received oncotype score of 14/9%. Physician team notified. Called pt with results. Discussed she does not need chemotherapy and her next step is xrt and that she will receive a call with an appt. Received verbal understanding.

## 2017-02-08 ENCOUNTER — Encounter: Payer: Self-pay | Admitting: Radiation Oncology

## 2017-02-08 NOTE — Progress Notes (Signed)
Location of Breast Cancer:Lower-inner quadrant of left breast in female, 6:30 position , Right Breast ,9:30 position   Histology per Pathology Report: Diagnosis 01/08/2017: Breast, right, needle core biopsy, 9:30 o'clock - INVASIVE MAMMARY CARCINOMA - MAMMARY CARCINOMA IN-SITU Diagnosis 12/18/16 Breast, left, needle core biopsy, 6:30 o'clock - INVASIVE DUCTAL CARCINOMA, GRADE I.  Diagnosis 01/18/17:1. Breast, lumpectomy, Left  Dr. Donne Hazel, saw follow up 02/08/17 will see again 02/13/17 for rash  - INVASIVE DUCTAL CARCINOMA, GRADE I/III, SPANNING 1.2 CM. - DUCTAL CARCINOMA IN SITU, LOW GRADE. - THE SURGICAL RESECTION MARGINS ARE NEGATIVE FOR CARCINOMA. - SEE ONCOLOGY TABLE BELOW. 2. Breast, excision, Left additional deep margin - FIBROCYSTIC CHANGES WITH ADENOSIS. - USUAL DUCTAL HYPERPLASIA. - THERE IS NO EVIDENCE OF MALIGNANCY. - SEE COMMENT. 3. Breast, lumpectomy, Right - LOBULAR NEOPLASIA (LOBULAR CARCINOMA IN SITU). - SEE COMMENT. 4. Breast, excision, Right additional Posterior Lateral - INVASIVE LOBULAR CARCINOMA, GRADE I/III, SPANNING, SPANNING 0.4 CM. - THE SURGICAL RESECTION MARGINS ARE NEGATIVE FOR CARCINOMA. - SEE ONCOLOGY TABLE BELOW. 5. Lymph node, sentinel, biopsy, Right Axillary - THERE IS NO EVIDENCE OF CARCINOMA IN 1 OF 1 LYMPH NODE (0/1). - SEE COMMENT. 6. Lymph node, sentinel, biopsy, Left Axillary - THERE IS NO EVIDENCE OF CARCINOMA IN 1 OF 1 LYMPH NODE (0/1). - SEE COMMENT. 7. Breast, excision, Left additional Inferior Margin - FIBROCYSTIC CHANGES. - THERE IS NO EVIDENCE OF MALIGNANCY. - SEE COMMENT. Microscopic Comment 1. BREAST, INVASIVE TUMOR (LEFT BREAST) Procedure: Seed localized lumpectomy 1 of Receptor Status: Left Breast  ER(90% ), PR (90% ), Her2-neu (No amplification was detected. The ratio was 1.38 ), Ki-(5% )  Receptor Status: Right Breast  ER(40% strong), PR (40% strong), Her2-neu (No amplification was detected. The ratio was 1.29 ), Ki-(5%  )  4. Breast Invasive Tumor Right Breast  Did patient present with symptoms (if so, please note symptoms) or was this found on screening mammography?:lump was found by screening mammogram  Past/Anticipated interventions by surgeon, if any:Dr. Rolm Bookbinder MD Bi lateral  Lumpectomies: 01/18/17  Past/Anticipated interventions by medical oncology, if any: Chemotherapy :Dr. Burr Medico 12/26/16 appt Oncotype=14/9 %,no chemoptherapy needed  Lymphedema issues, if any:  Lymph tx 02/18/17 post op visit  Pain issues, if any: discomfort from rash on left side  Near breast, was on prednisone for rash on right side breast  SAFETY ISSUES: NO  Prior radiation? : NO  Pacemaker/ICD? : NO  Possible current pregnancy?: NO  Is the patient on methotrexate? : NO  Current Complaints / other details: Married, Menarche 16,G3P2,A1,misscarriage,first birth at age 38, ADH in left breast ,surgery to remove 2007, smoked 1/2pack per week 15 years,quit 12/26/2001/ Hot flashes,night sweats ,contraceeptives x 20 years  Mother and maternal grandmother colon cancer, Father prostate cancer,   Allergies:PCNS-childhood unknown BP (!) 105/49   Pulse 74   Temp 98.2 F (36.8 C) (Oral)   Resp 20   Ht 5' 4.5" (1.638 m)   Wt 140 lb (63.5 kg)   BMI 23.66 kg/m     Georgena Spurling, RN 02/08/2017,12:26 PM

## 2017-02-12 ENCOUNTER — Ambulatory Visit
Admission: RE | Admit: 2017-02-12 | Discharge: 2017-02-12 | Disposition: A | Discharge status: Home / Self Care | Payer: 59 | Source: Ambulatory Visit | Attending: Radiation Oncology | Admitting: Radiation Oncology

## 2017-02-12 ENCOUNTER — Encounter: Payer: Self-pay | Admitting: Radiation Oncology

## 2017-02-12 ENCOUNTER — Institutional Professional Consult (permissible substitution): Payer: 59 | Admitting: Radiation Oncology

## 2017-02-12 DIAGNOSIS — Z170 Estrogen receptor positive status [ER+]: Secondary | ICD-10-CM | POA: Diagnosis not present

## 2017-02-12 DIAGNOSIS — C50312 Malignant neoplasm of lower-inner quadrant of left female breast: Secondary | ICD-10-CM

## 2017-02-12 DIAGNOSIS — Z87891 Personal history of nicotine dependence: Secondary | ICD-10-CM | POA: Diagnosis not present

## 2017-02-12 DIAGNOSIS — Z9889 Other specified postprocedural states: Secondary | ICD-10-CM | POA: Diagnosis not present

## 2017-02-12 DIAGNOSIS — C50411 Malignant neoplasm of upper-outer quadrant of right female breast: Secondary | ICD-10-CM | POA: Diagnosis not present

## 2017-02-12 DIAGNOSIS — Z510 Encounter for antineoplastic radiation therapy: Secondary | ICD-10-CM | POA: Diagnosis not present

## 2017-02-12 DIAGNOSIS — Z808 Family history of malignant neoplasm of other organs or systems: Secondary | ICD-10-CM | POA: Diagnosis not present

## 2017-02-12 DIAGNOSIS — C50011 Malignant neoplasm of nipple and areola, right female breast: Secondary | ICD-10-CM

## 2017-02-12 HISTORY — DX: Allergy, unspecified, initial encounter: T78.40XA

## 2017-02-12 NOTE — Progress Notes (Signed)
Radiation Oncology         ([phone]) [phone] ________________________________  Name: Pamela Whitney        MRN: [id number]  Date of Service: 02/12/2017 DOB: 23-Jul-1965  CC:Olga Millers, MD  Rolm Bookbinder, MD     REFERRING PHYSICIAN: Rolm Bookbinder, MD   DIAGNOSIS: There were no encounter diagnoses.   HISTORY OF PRESENT ILLNESS: Pamela Whitney is a 51 y.o. female originally seen in the multidisciplinary breast clinic for a new diagnosis of left breast cancer. She has a history of atypical ductal hyperplasia that she underwent left lumpectomy for at the age of 45. She did not take antiestrogen medication following this. Recently on screening mammogram, there was a detected left breast abnormality. She underwent diagnostic imaging and this revealed a 9 x 7 x 9 mm lesion at 6:30 and her axilla was negative by ultrasound. 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   When she was seen there was quesitonable left cervical adenopathy. She underwent CT of the neck, and this was negative for disease, and rather a jugular venous variant. She had a bilateral breast MRI on 12/31/16 and this revealed the known area of malignancy at 6:30 which was 1.2 cm, and an indeterminate 8 mm lesion at 9:30 in the right breast as well. She had an ultrasound on 01/03/17 of the right breast revealing no correlate. On 01/08/17 a biopsy under MRI guidance revealed an invasive lobular carcinoma, ER/PR positive, HER2 negative, grade 1. She underwent bilateral lumpectomies on 01/18/17 revealing grade 1 invasive ductal carcinoma on the left, measuring 1.2 cm. All margins were negative and her sentinel node was negative. On the right, she had a grade 1, 4 mm invasive lobular carcinoma with low grade LCIS, also with a negative node and negative margins. She has met with genetics and does not have any genetic  mutations. Her tumor was tested for oncotype and was 14. She comes today to discuss beginning radiotherapy.   PREVIOUS RADIATION THERAPY: No   PAST MEDICAL HISTORY:  Past Medical History:  Diagnosis Date  . Allergy    PCNS  . Breast cancer ([place]) 01/2017   Bilateral  . Family history of colon cancer   . Family history of prostate cancer in father        PAST SURGICAL HISTORY: Past Surgical History:  Procedure Laterality Date  . CESAREAN SECTION    . RADIOACTIVE SEED GUIDED PARTIAL MASTECTOMY WITH AXILLARY SENTINEL LYMPH NODE BIOPSY Bilateral 01/18/2017   Procedure: BILATERAL BREAST LUMPECTOMY WITH  BILATERAL RADIOACTIVE SEED AND BILATERAL SENTINEL LYMPH NODE BIOPSY;  Surgeon: Rolm Bookbinder, MD;  Location: [facility];  Service: General;  Laterality: Bilateral;  . RHINOPLASTY    . WISDOM TOOTH EXTRACTION       FAMILY HISTORY:  Family History  Problem Relation Age of Onset  . Colon cancer Mother 33  . Prostate cancer Father 18       prostectomy, no radiation  . Colon cancer Maternal Grandmother 29  . Stroke Paternal Grandfather   . HIV Paternal Uncle   . Breast cancer Neg Hx      SOCIAL HISTORY:  reports that she quit smoking about 15 years ago. Her smoking use included cigarettes. She has a 1.50 pack-year smoking history. she has never used smokeless tobacco. She reports that she drinks about 0.6 oz of alcohol per week.  She reports that she does not use drugs. The patient is married and lives in [place]. 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 She has two children that are 38 and 15, and who are active in gymnastics and Systems analyst.   ALLERGIES: Penicillins   MEDICATIONS:  Current Outpatient Medications  Medication Sig Dispense Refill  . ibuprofen (ADVIL,MOTRIN) 200 MG tablet Take 200 mg every 6 (six) hours as needed by mouth.    . oxyCODONE (OXY IR/ROXICODONE) 5 MG immediate release tablet Take 1  tablet (5 mg total) every 6 (six) hours as needed by mouth for moderate pain, severe pain or breakthrough pain. 20 tablet 0   No current facility-administered medications for this encounter.      REVIEW OF SYSTEMS: On review of systems, the patient reports that she is doing well overall. She denies any chest pain, shortness of breath, cough, fevers, chills, night sweats, unintended weight changes. She denies any bowel or bladder disturbances, and denies abdominal pain, nausea or vomiting. She denies any new musculoskeletal or joint aches or pains. A complete review of systems is obtained and is otherwise negative.     PHYSICAL EXAM:  Wt Readings from Last 3 Encounters:  01/18/17 138 lb (62.6 kg)  12/26/16 138 lb 3.2 oz (62.7 kg)   Temp Readings from Last 3 Encounters:  01/18/17 98.1 F (36.7 C) (Oral)  12/26/16 99.4 F (37.4 C) (Oral)   BP Readings from Last 3 Encounters:  01/18/17 118/74  12/26/16 120/65   Pulse Readings from Last 3 Encounters:  01/18/17 86  12/26/16 67     In general this is a well appearing caucasian female in no acute distress. She is alert and oriented x4 and appropriate throughout the examination. HEENT reveals that the patient is normocephalic, atraumatic. EOMs are intact. PERRLA. Skin is intact and beneath the left breast, there is mild erythema consistent with dermatitis, and the area measures about 4 cm in greatest dimension. No blistering or papules are noted. Bilateral breast exam is performed and reveals well healed axillary and periareolar incision sites. No erythema is noted of the incision sites themselves.  ECOG = 0  0 - Asymptomatic (Fully active, able to carry on all predisease activities without restriction)  1 - Symptomatic but completely ambulatory (Restricted in physically strenuous activity but ambulatory and able to carry out work of a light or sedentary nature. For example, light housework, office work)  2 - Symptomatic, <50% in bed  during the day (Ambulatory and capable of all self care but unable to carry out any work activities. Up and about more than 50% of waking hours)  3 - Symptomatic, >50% in bed, but not bedbound (Capable of only limited self-care, confined to bed or chair 50% or more of waking hours)  4 - Bedbound (Completely disabled. Cannot carry on any self-care. Totally confined to bed or chair)  5 - Death   Eustace Pen MM, Creech RH, Tormey DC, et al. [phone]). "Toxicity and response criteria of the [facility]". [place] Oncol. 5 (6): 649-55    LABORATORY DATA:  Lab Results  Component Value Date   WBC 5.1 12/26/2016   HGB 12.9 12/26/2016   HCT 36.9 12/26/2016   MCV 93.5 12/26/2016   PLT 267 12/26/2016   Lab Results  Component Value Date   NA 138 12/26/2016   K 4.4 12/26/2016   CO2 25 12/26/2016   Lab Results  Component  Value Date   ALT 18 12/26/2016   AST 20 12/26/2016   ALKPHOS 56 12/26/2016   BILITOT 0.50 12/26/2016      RADIOGRAPHY: Nm Sentinel Node Inj-no Rpt (breast)  Result Date: 01/18/2017 Sulfur colloid was injected by the nuclear medicine technologist for melanoma sentinel node.   Nm Sentinel Node Inj-no Rpt (breast)  Result Date: 01/18/2017 Sulfur colloid was injected by the nuclear medicine technologist for melanoma sentinel node.   Mm Breast Surgical Specimen  Result Date: 01/18/2017 CLINICAL DATA:  Radioactive seed was placed in the right breast prior to lumpectomy. EXAM: SPECIMEN RADIOGRAPH OF THE RIGHT BREAST COMPARISON:  Previous exam(s). FINDINGS: Status post excision of the right breast. The radioactive seed and biopsy marker clip are present, completely intact, and were marked for pathology. IMPRESSION: Specimen radiograph of the right breast. Electronically Signed   By: Curlene Dolphin M.D.   On: 01/18/2017 11:04   Mm Breast Surgical Specimen  Result Date: 01/18/2017 CLINICAL DATA:  The patient underwent lumpectomy. Radioactive seed  localization was performed of the left breast 01/16/2017. EXAM: SPECIMEN RADIOGRAPH OF THE LEFT BREAST COMPARISON:  Previous exam(s). FINDINGS: Status post excision of the left breast. The radioactive seed and biopsy marker clip are present, completely intact, and were marked for pathology. IMPRESSION: Specimen radiograph of the left breast. Electronically Signed   By: Curlene Dolphin M.D.   On: 01/18/2017 10:51   Mm Lt Radioactive Seed Loc Mammo Guide  Result Date: 01/16/2017 CLINICAL DATA:  Left-sided localization of breast cancer EXAM: MAMMOGRAPHIC GUIDED RADIOACTIVE SEED LOCALIZATION OF THE LEFT BREAST COMPARISON:  Previous exam(s). FINDINGS: Patient presents for radioactive seed localization prior to surgery. I met with the patient and we discussed the procedure of seed localization including benefits and alternatives. We discussed the high likelihood of a successful procedure. We discussed the risks of the procedure including infection, bleeding, tissue injury and further surgery. We discussed the low dose of radioactivity involved in the procedure. Informed, written consent was given. The usual time-out protocol was performed immediately prior to the procedure. Using mammographic guidance, sterile technique, 1% lidocaine and an I-125 radioactive seed, the biopsy clip was localized using a medial approach. The follow-up mammogram images confirm the seed in the expected location and were marked for the surgeon. Follow-up survey of the patient confirms presence of the radioactive seed. Order number of I-125 seed:  [id number]. Total activity:  9.147 millicuries  Reference Date: December 20, 2016 The patient tolerated the procedure well and was released from the [facility]. She was given instructions regarding seed removal. IMPRESSION: Radioactive seed localization left breast. No apparent complications. Electronically Signed   By: Dorise Bullion III M.D   On: 01/16/2017 12:07   Mm Rt Radioactive Seed Loc  Mammo Guide  Result Date: 01/16/2017 CLINICAL DATA:  Localization of right breast cancer EXAM: MAMMOGRAPHIC GUIDED RADIOACTIVE SEED LOCALIZATION OF THE RIGHT BREAST COMPARISON:  Previous exam(s). FINDINGS: Patient presents for radioactive seed localization prior to surgery. I met with the patient and we discussed the procedure of seed localization including benefits and alternatives. We discussed the high likelihood of a successful procedure. We discussed the risks of the procedure including infection, bleeding, tissue injury and further surgery. We discussed the low dose of radioactivity involved in the procedure. Informed, written consent was given. The usual time-out protocol was performed immediately prior to the procedure. Using mammographic guidance, sterile technique, 1% lidocaine and an I-125 radioactive seed, the biopsy clip was localized using a lateral approach. The  follow-up mammogram images confirm the seed in the expected location and were marked for the surgeon. Follow-up survey of the patient confirms presence of the radioactive seed. Order number of I-125 seed:  [id number]. Total activity:  6.578 millicuries  Reference Date: December 20, 2016 The patient tolerated the procedure well and was released from the [facility]. She was given instructions regarding seed removal. IMPRESSION: Radioactive seed localization right breast. No apparent complications. Electronically Signed   By: Dorise Bullion III M.D   On: 01/16/2017 12:08       IMPRESSION/PLAN: 1. Stage IA,[id number] ER/PR positive, grade 1 invasive ductal carcinoma of the left breast with Stage IA [id number], ER/PR positive, grade 1 invasive lobular carcinoma and LCIS of the right breast. Dr. Lisbeth Renshaw reviews her pathology results and discusses the role for adjuvant radiotherapy to the breast followed by antiestrogen therapy. We discussed the risks, benefits, short, and long term effects of radiotherapy, and the patient is interested in  proceeding. Dr. Lisbeth Renshaw discusses the delivery and logistics of radiotherapy and anticipates a course of 4-6 1/2 with deep inspiration breath hold technique for bilateral breasts. She is interested in the hypofractionated course. She would like to simulate in January after the holidays. Written consent is obtained and placed in the chart, a copy was provided to the patient. 2. Contact dermatitis. This appears to be mild, but we will defer to Dr. Donne Hazel when he sees her tomorrow, as she's noted increase in the size of this.  In a visit lasting 25 minutes, greater than 50% of the time was spent face to face discussing her pathology and options of hypofractionation versus standard fractionation, and coordinating the patient's care.  The above documentation reflects my direct findings during this shared patient visit. Please see the separate note by Dr. Lisbeth Renshaw on this date for the remainder of the patient's plan of care.    Carola Rhine, PAC

## 2017-02-12 NOTE — Progress Notes (Signed)
Please see the Nurse Progress Note in the MD Initial Consult Encounter for this patient. 

## 2017-02-18 ENCOUNTER — Encounter: Payer: Self-pay | Admitting: Physical Therapy

## 2017-02-18 ENCOUNTER — Ambulatory Visit: Payer: 59 | Attending: General Surgery | Admitting: Physical Therapy

## 2017-02-18 DIAGNOSIS — Z170 Estrogen receptor positive status [ER+]: Secondary | ICD-10-CM | POA: Diagnosis present

## 2017-02-18 DIAGNOSIS — C50912 Malignant neoplasm of unspecified site of left female breast: Secondary | ICD-10-CM | POA: Insufficient documentation

## 2017-02-18 DIAGNOSIS — C50911 Malignant neoplasm of unspecified site of right female breast: Secondary | ICD-10-CM | POA: Diagnosis not present

## 2017-02-18 DIAGNOSIS — R293 Abnormal posture: Secondary | ICD-10-CM | POA: Diagnosis present

## 2017-02-18 NOTE — Therapy (Signed)
[place], [place], [address] Phone: [phone]   Fax:  [phone]  Physical Therapy Treatment  Patient Details  Name: Pamela Whitney MRN: [id number] Date of Birth: Aug 19, 1965 Referring Provider: Dr. Rolm Bookbinder   Encounter Date: 02/18/2017  PT End of Session - 02/18/17 1127    Visit Number  2    Number of Visits  2    PT Start Time  1100    PT Stop Time  7353    PT Time Calculation (min)  26 min    Activity Tolerance  Patient tolerated treatment well    Behavior During Therapy  [facility] for tasks assessed/performed       Past Medical History:  Diagnosis Date  . Allergy    PCNS  . Breast cancer ([place]) 01/2017   Bilateral  . Family history of colon cancer   . Family history of prostate cancer in father     Past Surgical History:  Procedure Laterality Date  . CESAREAN SECTION    . RADIOACTIVE SEED GUIDED PARTIAL MASTECTOMY WITH AXILLARY SENTINEL LYMPH NODE BIOPSY Bilateral 01/18/2017   Procedure: BILATERAL BREAST LUMPECTOMY WITH  BILATERAL RADIOACTIVE SEED AND BILATERAL SENTINEL LYMPH NODE BIOPSY;  Surgeon: Rolm Bookbinder, MD;  Location: [facility];  Service: General;  Laterality: Bilateral;  . RHINOPLASTY    . WISDOM TOOTH EXTRACTION      There were no vitals filed for this visit.  Subjective Assessment - 02/18/17 1102    Subjective  Patient reports she underwent a bilateral lumpectomy and bilateral sentinel node biopsies on 01/18/17 since they found lobular cancer in her right breast on MRI. Her Oncotype score was 14 so no need for chemotherapy. She will undergo bilateral breast radiation on 03/11/17. This will be followed by Tamoxifen for 10 years.    Pertinent History  Patient was diagnosed on 12/03/16 with left grade 1 invasive ductal carcinoma breast cancer. It measures 9 mm and is located in the lower inner quadrant. It is ER/PR positive and HER2 negative with a Ki67  of 5%. 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    Patient Stated Goals  Make sure arm is ok.    Currently in Pain?  No/denies         [facility] PT Assessment - 02/18/17 0001      Assessment   Medical Diagnosis  s/p bilateral lumpectomies for breast cancer    Referring Provider  Dr. Rolm Bookbinder    Onset Date/Surgical Date  01/18/17    Hand Dominance  Right    Prior Therapy  none      Precautions   Precautions  Other (comment)    Precaution Comments  bilateral lymphedema risk      Restrictions   Weight Bearing Restrictions  No      Balance Screen   Has the patient fallen in the past 6 months  No    Has the patient had a decrease in activity level because of a fear of falling?   No    Is the patient reluctant to leave their home because of a fear of falling?   No      Home Environment   Living Environment  Private residence    Living Arrangements  Spouse/significant other;Children    Available Help at Discharge  Family      Prior Function   Level of Cool  Vocation  Full time employment    Vocation Requirements  HR - office work    Leisure  She does not exercise      Cognition   Overall Cognitive Status  Within Functional Limits for tasks assessed      Posture/Postural Control   Posture/Postural Control  Postural limitations    Postural Limitations  Rounded Shoulders;Forward head    Posture Comments  Reports she works hard to correct posture since she sits much of the day      AROM   Right Shoulder Extension  66 Degrees    Right Shoulder Flexion  158 Degrees    Right Shoulder ABduction  180 Degrees    Right Shoulder Internal Rotation  73 Degrees    Right Shoulder External Rotation  90 Degrees    Left Shoulder Extension  71 Degrees    Left Shoulder Flexion  154 Degrees    Left Shoulder ABduction  174 Degrees    Left Shoulder Internal Rotation  70 Degrees     Left Shoulder External Rotation  82 Degrees      Palpation   Palpation comment  Axillary and breast incisions appear to be healing normally; some mild edema present in left axilla which appears to be typical post surgical swelling        LYMPHEDEMA/ONCOLOGY QUESTIONNAIRE - 02/18/17 1116      Type   Cancer Type  s/p bilateral lumpectomies with SNLBs      Surgeries   Lumpectomy Date  01/18/17    Sentinel Lymph Node Biopsy Date  01/18/17    Number Lymph Nodes Removed  2 One on left; 1 on right      Treatment   Active Chemotherapy Treatment  No    Past Chemotherapy Treatment  No    Active Radiation Treatment  No    Past Radiation Treatment  No    Current Hormone Treatment  No    Past Hormone Therapy  No      What other symptoms do you have   Are you Having Heaviness or Tightness  No    Are you having Pain  No    Are you having pitting edema  No    Is it Hard or Difficult finding clothes that fit  No    Do you have infections  No    Is there Decreased scar mobility  No    Stemmer Sign  No      Lymphedema Assessments   Lymphedema Assessments  Upper extremities      Right Upper Extremity Lymphedema   10 cm Proximal to Olecranon Process  25.4 cm    Olecranon Process  23.4 cm    10 cm Proximal to Ulnar Styloid Process  20.5 cm    Just Proximal to Ulnar Styloid Process  14.8 cm    Across Hand at [employer]  17.5 cm    At [place] of 2nd Digit  6 cm      Left Upper Extremity Lymphedema   10 cm Proximal to Olecranon Process  24.8 cm    Olecranon Process  23.1 cm    10 cm Proximal to Ulnar Styloid Process  19.7 cm    Just Proximal to Ulnar Styloid Process  14.3 cm    Across Hand at [employer]  16.4 cm    At [place] of 2nd Digit  5.9 cm        Quick Dash - 02/18/17 0001    Open  a tight or new jar  No difficulty    Do heavy household chores (wash walls, wash floors)  No difficulty    Carry a shopping bag or briefcase  No difficulty    Wash your back  No difficulty     Use a knife to cut food  No difficulty    Recreational activities in which you take some force or impact through your arm, shoulder, or hand (golf, hammering, tennis)  No difficulty    During the past week, to what extent has your arm, shoulder or hand problem interfered with your normal social activities with family, friends, neighbors, or groups?  Not at all    During the past week, to what extent has your arm, shoulder or hand problem limited your work or other regular daily activities  Not at all    Arm, shoulder, or hand pain.  None    Tingling (pins and needles) in your arm, shoulder, or hand  None    Difficulty Sleeping  No difficulty    DASH Score  0 %                    PT Education - 02/18/17 1127    Education provided  Yes    Education Details  Encouraged patient to do gentle scar massage at axillae and continue ROM exercises during radiation.    Person(s) Educated  Patient    Methods  Explanation    Comprehension  Verbalized understanding           Breast Clinic Goals - 12/26/16 1054      Patient will be able to verbalize understanding of pertinent lymphedema risk reduction practices relevant to her diagnosis specifically related to skin care.   Time  1    Period  Days    Status  Achieved      Patient will be able to return demonstrate and/or verbalize understanding of the post-op home exercise program related to regaining shoulder range of motion.   Time  1    Period  Days    Status  Achieved      Patient will be able to verbalize understanding of the importance of attending the postoperative After Breast Cancer Class for further lymphedema risk reduction education and therapeutic exercise.   Time  1    Period  Days    Status  Achieved           Plan - 02/18/17 1128    Clinical Impression Statement  Patient is 1 month s/p bilateral lumpectomies with sentinel node biopsies for 2 different kinds of breast cancer. She had invasive ductal on the  left and invasive lobular on the right. She has recovered well and appears to be back to baseline. She does not need chemotherapy as her oncotype score was 14 but will undergo bilateral breast radiation. there is no need for further PT at this time.    PT Treatment/Interventions  ADLs/Self Care Home Management;Therapeutic exercise;Patient/family education    PT Next Visit Plan  D/C patient    PT Home Exercise Plan  Post op shoulder ROM HEP    Consulted and Agree with Plan of Care  Patient       Patient will benefit from skilled therapeutic intervention in order to improve the following deficits and impairments:  Decreased range of motion, Impaired UE functional use, Pain, Decreased knowledge of precautions, Postural dysfunction  Visit Diagnosis: Bilateral malignant neoplasm of breast in female, estrogen receptor positive, unspecified  site of breast ([place])  Abnormal posture     Problem List Patient Active Problem List   Diagnosis Date Noted  . Malignant neoplasm of unspecified site of right female breast ([place]) 02/12/2017  . Genetic testing 01/08/2017  . Family history of prostate cancer 12/26/2016  . Family history of colon cancer   . Malignant neoplasm of lower-inner quadrant of left breast in female, estrogen receptor positive ([place]) 12/20/2016    PHYSICAL THERAPY DISCHARGE SUMMARY  Visits from Start of Care: 2  Current functional level related to goals / functional outcomes: Patient has regained shoulder ROM and appears to have no sign of lymphedema.   Remaining deficits: none  Education / Equipment: Education on lymphedema risk reduction  Plan: Patient agrees to discharge.  Patient goals were partially met. Patient is being discharged due to meeting the stated rehab goals.  ?????         Annia Friendly, [place] 02/18/17 11:32 AM   [place], [place], [address] Phone: [phone]   Fax:   [phone]  Name: Pamela Whitney MRN: [id number] Date of Birth: 09-May-1965

## 2017-03-07 ENCOUNTER — Ambulatory Visit
Admission: RE | Admit: 2017-03-07 | Discharge: 2017-03-07 | Disposition: A | Discharge status: Home / Self Care | Payer: 59 | Source: Ambulatory Visit | Attending: Radiation Oncology | Admitting: Radiation Oncology

## 2017-03-07 DIAGNOSIS — C50312 Malignant neoplasm of lower-inner quadrant of left female breast: Secondary | ICD-10-CM

## 2017-03-07 DIAGNOSIS — Z510 Encounter for antineoplastic radiation therapy: Secondary | ICD-10-CM | POA: Diagnosis not present

## 2017-03-07 DIAGNOSIS — C50411 Malignant neoplasm of upper-outer quadrant of right female breast: Secondary | ICD-10-CM

## 2017-03-07 DIAGNOSIS — Z170 Estrogen receptor positive status [ER+]: Principal | ICD-10-CM

## 2017-03-07 NOTE — Progress Notes (Signed)
  Radiation Oncology         [phone] ________________________________  Name: Pamela Whitney MRN: [id number]  Date: 03/07/2017  DOB: [birth year]  Optical Surface Tracking Plan:  Since intensity modulated radiotherapy (IMRT) and 3D conformal radiation treatment methods are predicated on accurate and precise positioning for treatment, intrafraction motion monitoring is medically necessary to ensure accurate and safe treatment delivery.  The ability to quantify intrafraction motion without excessive ionizing radiation dose can only be performed with optical surface tracking. Accordingly, surface imaging offers the opportunity to obtain 3D measurements of patient position throughout IMRT and 3D treatments without excessive radiation exposure.  I am ordering optical surface tracking for this patient's upcoming course of radiotherapy. ________________________________  Kyung Rudd, MD 03/07/2017 7:21 PM    Reference:   Particia Jasper, et al. Surface imaging-based analysis of intrafraction motion for breast radiotherapy patients.Journal of [employer], n. 6, nov. 2014. ISSN [id number].   Available at: <http://www.jacmp.org/index.php/jacmp/article/view/4957>.

## 2017-03-07 NOTE — Progress Notes (Signed)
  Radiation Oncology         [phone] ________________________________  Name: BERDENA CISEK MRN: [id number]  Date: 03/07/2017  DOB: [birth year]   DIAGNOSIS:     ICD-10-CM   1. Malignant neoplasm of lower-inner quadrant of left breast in female, estrogen receptor positive ([place]) C50.312    Z17.0   2. Carcinoma of upper-outer quadrant of right breast in female, estrogen receptor positive ([place]) C50.411    Z17.0     SIMULATION AND TREATMENT PLANNING NOTE  The patient presented for simulation prior to beginning her course of radiation treatment for her diagnosis of right-sided breast cancer and left-sided breast cancer. The patient was placed in a supine position on a breast board. A customized vac-lock bag was constructed and this complex treatment device will be used on a daily basis during her treatment. In this fashion, a CT scan was obtained through the chest area and an isocenter was placed near the chest wall within the breast.  The patient will be planned to receive a course of radiation initially to a dose of 42.5 Gy to both target regions, the left and right breasts separately. This will consist of a whole breast radiotherapy technique. To accomplish this, 2 customized blocks have been designed which will correspond to medial and lateral whole breast tangent fields for each separate target area. This treatment will be accomplished at 2.5 Gy per fraction. A forward planning technique will also be evaluated to determine if this approach improves the plan. It is anticipated that the patient will then receive a 7.5 Gy boost to the seroma cavities which have been contoured. This will be accomplished at 2.5 Gy per fraction.   This initial treatment will consist of a 3-D conformal technique. The seroma has been contoured as the primary target structure. Additionally, dose volume histograms of both this target as well as the lungs and heart will also be evaluated. Such an approach is necessary to  ensure that the target area is adequately covered while the nearby critical  normal structures are adequately spared.  Plan:  The final anticipated total dose therefore will correspond to 50 Gy to both the left and right breast separately.    _______________________________   Jodelle Gross, MD, PhD

## 2017-03-08 DIAGNOSIS — Z510 Encounter for antineoplastic radiation therapy: Secondary | ICD-10-CM | POA: Diagnosis not present

## 2017-03-11 ENCOUNTER — Telehealth: Payer: Self-pay | Admitting: Hematology

## 2017-03-11 ENCOUNTER — Ambulatory Visit
Admission: RE | Admit: 2017-03-11 | Discharge: 2017-03-11 | Disposition: A | Discharge status: Home / Self Care | Payer: 59 | Source: Ambulatory Visit | Attending: Radiation Oncology | Admitting: Radiation Oncology

## 2017-03-11 DIAGNOSIS — Z510 Encounter for antineoplastic radiation therapy: Secondary | ICD-10-CM | POA: Diagnosis not present

## 2017-03-11 NOTE — Telephone Encounter (Signed)
Called patient regarding 1/30

## 2017-03-12 ENCOUNTER — Ambulatory Visit
Admission: RE | Admit: 2017-03-12 | Discharge: 2017-03-12 | Disposition: A | Discharge status: Home / Self Care | Payer: 59 | Source: Ambulatory Visit | Attending: Radiation Oncology | Admitting: Radiation Oncology

## 2017-03-12 DIAGNOSIS — Z510 Encounter for antineoplastic radiation therapy: Secondary | ICD-10-CM | POA: Diagnosis not present

## 2017-03-13 ENCOUNTER — Ambulatory Visit
Admission: RE | Admit: 2017-03-13 | Discharge: 2017-03-13 | Disposition: A | Discharge status: Home / Self Care | Payer: 59 | Source: Ambulatory Visit | Attending: Radiation Oncology | Admitting: Radiation Oncology

## 2017-03-13 DIAGNOSIS — Z510 Encounter for antineoplastic radiation therapy: Secondary | ICD-10-CM | POA: Diagnosis not present

## 2017-03-14 ENCOUNTER — Ambulatory Visit
Admission: RE | Admit: 2017-03-14 | Discharge: 2017-03-14 | Disposition: A | Discharge status: Home / Self Care | Payer: 59 | Source: Ambulatory Visit | Attending: Radiation Oncology | Admitting: Radiation Oncology

## 2017-03-14 DIAGNOSIS — Z510 Encounter for antineoplastic radiation therapy: Secondary | ICD-10-CM | POA: Diagnosis not present

## 2017-03-15 ENCOUNTER — Ambulatory Visit
Admission: RE | Admit: 2017-03-15 | Discharge: 2017-03-15 | Disposition: A | Discharge status: Home / Self Care | Payer: 59 | Source: Ambulatory Visit | Attending: Radiation Oncology | Admitting: Radiation Oncology

## 2017-03-15 ENCOUNTER — Ambulatory Visit: Payer: 59 | Admitting: Radiation Oncology

## 2017-03-15 DIAGNOSIS — C50312 Malignant neoplasm of lower-inner quadrant of left female breast: Secondary | ICD-10-CM

## 2017-03-15 DIAGNOSIS — Z510 Encounter for antineoplastic radiation therapy: Secondary | ICD-10-CM | POA: Diagnosis not present

## 2017-03-15 DIAGNOSIS — Z170 Estrogen receptor positive status [ER+]: Principal | ICD-10-CM

## 2017-03-15 MED ORDER — ALRA NON-METALLIC DEODORANT (RAD-ONC)
1.0000 "application " | Freq: Once | TOPICAL | Status: AC
  Administered 2017-03-15: 1 via TOPICAL

## 2017-03-15 MED ORDER — RADIAPLEXRX EX GEL
Freq: Two times a day (BID) | CUTANEOUS | Status: DC
  Administered 2017-03-15: 17:00:00 via TOPICAL

## 2017-03-15 NOTE — Progress Notes (Signed)

## 2017-03-18 ENCOUNTER — Ambulatory Visit
Admission: RE | Admit: 2017-03-18 | Discharge: 2017-03-18 | Disposition: A | Discharge status: Home / Self Care | Payer: 59 | Source: Ambulatory Visit | Attending: Radiation Oncology | Admitting: Radiation Oncology

## 2017-03-18 DIAGNOSIS — Z510 Encounter for antineoplastic radiation therapy: Secondary | ICD-10-CM | POA: Diagnosis not present

## 2017-03-19 ENCOUNTER — Ambulatory Visit
Admission: RE | Admit: 2017-03-19 | Discharge: 2017-03-19 | Disposition: A | Discharge status: Home / Self Care | Payer: 59 | Source: Ambulatory Visit | Attending: Radiation Oncology | Admitting: Radiation Oncology

## 2017-03-19 DIAGNOSIS — Z510 Encounter for antineoplastic radiation therapy: Secondary | ICD-10-CM | POA: Diagnosis not present

## 2017-03-20 ENCOUNTER — Ambulatory Visit
Admission: RE | Admit: 2017-03-20 | Discharge: 2017-03-20 | Disposition: A | Discharge status: Home / Self Care | Payer: 59 | Source: Ambulatory Visit | Attending: Radiation Oncology | Admitting: Radiation Oncology

## 2017-03-20 DIAGNOSIS — Z510 Encounter for antineoplastic radiation therapy: Secondary | ICD-10-CM | POA: Diagnosis not present

## 2017-03-21 ENCOUNTER — Ambulatory Visit
Admission: RE | Admit: 2017-03-21 | Discharge: 2017-03-21 | Disposition: A | Discharge status: Home / Self Care | Payer: 59 | Source: Ambulatory Visit | Attending: Radiation Oncology | Admitting: Radiation Oncology

## 2017-03-21 DIAGNOSIS — Z510 Encounter for antineoplastic radiation therapy: Secondary | ICD-10-CM | POA: Diagnosis not present

## 2017-03-22 ENCOUNTER — Ambulatory Visit
Admission: RE | Admit: 2017-03-22 | Discharge: 2017-03-22 | Disposition: A | Discharge status: Home / Self Care | Payer: 59 | Source: Ambulatory Visit | Attending: Radiation Oncology | Admitting: Radiation Oncology

## 2017-03-22 DIAGNOSIS — Z510 Encounter for antineoplastic radiation therapy: Secondary | ICD-10-CM | POA: Diagnosis not present

## 2017-03-23 ENCOUNTER — Ambulatory Visit: Payer: 59

## 2017-03-25 ENCOUNTER — Ambulatory Visit
Admission: RE | Admit: 2017-03-25 | Discharge: 2017-03-25 | Disposition: A | Discharge status: Home / Self Care | Payer: 59 | Source: Ambulatory Visit | Attending: Radiation Oncology | Admitting: Radiation Oncology

## 2017-03-25 DIAGNOSIS — Z510 Encounter for antineoplastic radiation therapy: Secondary | ICD-10-CM | POA: Diagnosis not present

## 2017-03-26 ENCOUNTER — Ambulatory Visit
Admission: RE | Admit: 2017-03-26 | Discharge: 2017-03-26 | Disposition: A | Discharge status: Home / Self Care | Payer: 59 | Source: Ambulatory Visit | Attending: Radiation Oncology | Admitting: Radiation Oncology

## 2017-03-26 DIAGNOSIS — Z170 Estrogen receptor positive status [ER+]: Secondary | ICD-10-CM | POA: Insufficient documentation

## 2017-03-26 DIAGNOSIS — Z510 Encounter for antineoplastic radiation therapy: Secondary | ICD-10-CM | POA: Insufficient documentation

## 2017-03-26 DIAGNOSIS — C50312 Malignant neoplasm of lower-inner quadrant of left female breast: Secondary | ICD-10-CM | POA: Diagnosis not present

## 2017-03-26 DIAGNOSIS — C50411 Malignant neoplasm of upper-outer quadrant of right female breast: Secondary | ICD-10-CM | POA: Insufficient documentation

## 2017-03-27 ENCOUNTER — Ambulatory Visit
Admission: RE | Admit: 2017-03-27 | Discharge: 2017-03-27 | Disposition: A | Discharge status: Home / Self Care | Payer: 59 | Source: Ambulatory Visit | Attending: Radiation Oncology | Admitting: Radiation Oncology

## 2017-03-27 DIAGNOSIS — Z510 Encounter for antineoplastic radiation therapy: Secondary | ICD-10-CM | POA: Diagnosis not present

## 2017-03-28 ENCOUNTER — Ambulatory Visit
Admission: RE | Admit: 2017-03-28 | Discharge: 2017-03-28 | Disposition: A | Discharge status: Home / Self Care | Payer: 59 | Source: Ambulatory Visit | Attending: Radiation Oncology | Admitting: Radiation Oncology

## 2017-03-28 DIAGNOSIS — Z510 Encounter for antineoplastic radiation therapy: Secondary | ICD-10-CM | POA: Diagnosis not present

## 2017-03-29 ENCOUNTER — Ambulatory Visit
Admission: RE | Admit: 2017-03-29 | Discharge: 2017-03-29 | Disposition: A | Discharge status: Home / Self Care | Payer: 59 | Source: Ambulatory Visit | Attending: Radiation Oncology | Admitting: Radiation Oncology

## 2017-03-29 DIAGNOSIS — Z510 Encounter for antineoplastic radiation therapy: Secondary | ICD-10-CM | POA: Diagnosis not present

## 2017-03-30 ENCOUNTER — Ambulatory Visit: Payer: 59

## 2017-04-01 ENCOUNTER — Ambulatory Visit
Admission: RE | Admit: 2017-04-01 | Discharge: 2017-04-01 | Disposition: A | Discharge status: Home / Self Care | Payer: 59 | Source: Ambulatory Visit | Attending: Radiation Oncology | Admitting: Radiation Oncology

## 2017-04-01 DIAGNOSIS — Z510 Encounter for antineoplastic radiation therapy: Secondary | ICD-10-CM | POA: Diagnosis not present

## 2017-04-01 NOTE — Progress Notes (Signed)
[facility]  [phone] Fax:([phone]) [phone]  Clinic Follow Up Note   Patient Care Team: Olga Millers, MD as PCP - General (Obstetrics and Gynecology) 04/03/2017  CHIEF COMPLAINTS:  Malignant neoplasm of lower-inner quadrant of left breast in female, estrogen receptor positive  Oncology History   Cancer Staging Malignant neoplasm of lower-inner quadrant of left breast in female, estrogen receptor positive ([place]) Staging form: Breast, AJCC 8th Edition - Clinical stage from 12/18/2016: cT1b, cN0, cM0, GX, ER: Positive, PR: Positive, HER2: Negative - Signed by Truitt Merle, MD on 12/25/2016       Malignant neoplasm of lower-inner quadrant of left breast in female, estrogen receptor positive ([place])   12/14/2016 Mammogram    Diagnostic mammogram 12/14/16 IMPRESSION: Suspicious 9 x 7 x 9 mm irregular lobular hypoechoic left breast mass at the 630 o'clock 3 cm from the nipple, felt to correspond with mammographic focal architectural distortion. RECOMMENDATION: Ultrasound-guided core needle biopsy left breast mass.      12/18/2016 Initial Biopsy    Diagnosis 12/18/16 Breast, left, needle core biopsy, 6:30 o'clock - INVASIVE DUCTAL CARCINOMA, GRADE I.       12/18/2016 Receptors her2    Estrogen Receptor: 90%, POSITIVE, STRONG STAINING INTENSITY Progesterone Receptor: 90%, POSITIVE, STRONG STAINING INTENSITY Proliferation Marker Ki67: 5% HER2 NEGATIVE       12/20/2016 Initial Diagnosis    Malignant neoplasm of lower-inner quadrant of left breast in female, estrogen receptor positive ([place])      01/07/2017 Genetic Testing    Patient had genetic testing due to a personal history of breeast cancer and a family history of prostate and colon cancer. The Common Hereditary Cancers Panel + Prostate Cancer Panelwas ordered. The Hereditary Gene Panel offered by [facility] includes sequencing and/or deletion duplication testing of the following 48 genes: APC, ATM,  AXIN2, BARD1, BMPR1A, BRCA1, BRCA2, BRIP1, CDH1, CDKN2A (p14ARF), CDKN2A (p16INK4a), CKD4, CHEK2, CTNNA1, DICER1, EPCAM (Deletion/duplication testing only), FANCA, GREM1 (promoter region deletion/duplication testing only), KIT, MEN1, MLH1, MSH2, MSH3, MSH6, MUTYH, NBN, NF1, NHTL1, PALB2, PDGFRA, PMS2, POLD1, POLE, PTEN, RAD50, RAD51C, RAD51D, SDHB, SDHC, SDHD, SMAD4, SMARCA4. STK11, TP53, TSC1, TSC2, and VHL.  The following genes were evaluated for sequence changes only: SDHA and HOXB13 c.251G>A variant only.  Results- Negative, no pathogenic variants identified.  The date of this test report is 01/07/2017      01/18/2017 Surgery    Bilateral Breast lumpectomy with Dr. Donne Hazel       01/18/2017 Pathology Results     Diagnosis 01/18/17 1. Breast, lumpectomy, Left - INVASIVE DUCTAL CARCINOMA, GRADE I/III, SPANNING 1.2 CM. - DUCTAL CARCINOMA IN SITU, LOW GRADE. - THE SURGICAL RESECTION MARGINS ARE NEGATIVE FOR CARCINOMA. - SEE ONCOLOGY TABLE BELOW. 2. Breast, excision, Left additional deep margin - FIBROCYSTIC CHANGES WITH ADENOSIS. - USUAL DUCTAL HYPERPLASIA. - THERE IS NO EVIDENCE OF MALIGNANCY. - SEE COMMENT. 3. Breast, lumpectomy, Right - LOBULAR NEOPLASIA (LOBULAR CARCINOMA IN SITU). - SEE COMMENT. 4. Breast, excision, Right additional Posterior Lateral - INVASIVE LOBULAR CARCINOMA, GRADE I/III, SPANNING, SPANNING 0.4 CM. - THE SURGICAL RESECTION MARGINS ARE NEGATIVE FOR CARCINOMA. - SEE ONCOLOGY TABLE BELOW. 5. Lymph node, sentinel, biopsy, Right Axillary - THERE IS NO EVIDENCE OF CARCINOMA IN 1 OF 1 LYMPH NODE (0/1). - SEE COMMENT. 6. Lymph node, sentinel, biopsy, Left Axillary - THERE IS NO EVIDENCE OF CARCINOMA IN 1 OF 1 LYMPH NODE (0/1). - SEE COMMENT. 7. Breast, excision, Left additional Inferior Margin - FIBROCYSTIC CHANGES. - THERE IS NO  EVIDENCE OF MALIGNANCY. - SEE COMMENT.       01/18/2017 Oncotype testing    Her oncotype recurrence score is 14 and her  distant recurrence on Tamoxifen is 10%.      03/12/2017 -  Radiation Therapy    Radiation therapy with Dr. Lisbeth Renshaw       Adjuvant Chemotherapy          HISTORY OF PRESENTING ILLNESS: 12/26/16 Pamela Whitney 52 y.o. female is here because of newly diagnosed left breast cancer. She presents to breast clinic today with her husband.   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 She had rhinoplasty at 52 yo and 2 C-sections. Her mother and maternal grandmother had colon cancer. She smoked on and off for about 15 years, half a pack a week.   Today she notes her lump was found by screening mammogram. She denies any breast change, pain or fatigue. This year her periods have becomes less regular. She works in Program[employer], applications. She notes for a few months she started having hot flashes and night sweats, but homone test was done and she is likely pre or perimenopausal. In the past 6 months she has felt a small lump in the left side of her lump. She notices her fingers are swelling but no pain.    GYN HISTORY  Menarchal: 16 LMP: 12/12/16 Contraceptive: for 20 years, stopped 10 years ago.  HRT: NA GP: G3P2A1, miscarriage, first birth at 68. Has 2 girls  CURRENT THERAPY: Radiation therapy started on 03/12/17 and will complete on 04/08/17. Tamoxifen 20 mg daily pending   INTERM HISTORY: Pamela Whitney is here for follow up. Of note since last visit, she underwent a bilateral breast lumpectomy with Dr. Donne Hazel on 01/18/17. She states that her surgery went well and she recovered quickly. No complications. She began radiation therapy with Dr. Lisbeth Renshaw on 03/12/17 and will complete it on 04/08/17. She reports she gets some spontaneous sharp pains that appear in her breast that is worse on the left side. She notes she is starting to get some itchiness in her breasts after radiation. She reports she still has her period and has been having painful menstrual cramps.   On review of  systems, pt denies breast swelling, arm swelling or any other complaints at this time. Pertinent positives are listed and detailed within the above HPI.   MEDICAL HISTORY:  Past Medical History:  Diagnosis Date  . Allergy    PCNS  . Breast cancer ([place]) 01/2017   Bilateral  . Family history of colon cancer   . Family history of prostate cancer in father     SURGICAL HISTORY: Past Surgical History:  Procedure Laterality Date  . CESAREAN SECTION    . RADIOACTIVE SEED GUIDED PARTIAL MASTECTOMY WITH AXILLARY SENTINEL LYMPH NODE BIOPSY Bilateral 01/18/2017   Procedure: BILATERAL BREAST LUMPECTOMY WITH  BILATERAL RADIOACTIVE SEED AND BILATERAL SENTINEL LYMPH NODE BIOPSY;  Surgeon: Rolm Bookbinder, MD;  Location: [facility];  Service: General;  Laterality: Bilateral;  . RHINOPLASTY    . WISDOM TOOTH EXTRACTION      SOCIAL HISTORY: Social History   Socioeconomic History  . Marital status: Married    Spouse name: Not on file  . Number of children: Not on file  . Years of education: Not on file  . Highest education level: Not on file  Social Needs  . Financial resource strain: Not on file  .  Food insecurity - worry: Not on file  . Food insecurity - inability: Not on file  . Transportation needs - medical: Not on file  . Transportation needs - non-medical: Not on file  Occupational History  . Not on file  Tobacco Use  . Smoking status: Former Smoker    Packs/day: 0.10    Years: 15.00    Pack years: 1.50    Types: Cigarettes    Last attempt to quit: 12/26/2001    Years since quitting: 15.2  . Smokeless tobacco: Never Used  Substance and Sexual Activity  . Alcohol use: Yes    Alcohol/week: 0.6 oz    Types: 1 Cans of beer per week    Comment: 3-5 times a week   . Drug use: No  . Sexual activity: Yes    Birth control/protection: Surgical    Comment: husband has had vasectomy  Other Topics Concern  . Not on file  Social History Narrative  . Not on file     FAMILY HISTORY: Family History  Problem Relation Age of Onset  . Colon cancer Mother 8  . Prostate cancer Father 30       prostectomy, no radiation  . Colon cancer Maternal Grandmother 52  . Stroke Paternal Grandfather   . HIV Paternal Uncle   . Breast cancer Neg Hx     ALLERGIES:  is allergic to penicillins.  MEDICATIONS:  Current Outpatient Medications  Medication Sig Dispense Refill  . ibuprofen (ADVIL,MOTRIN) 200 MG tablet Take 200 mg every 6 (six) hours as needed by mouth.    . tamoxifen (NOLVADEX) 20 MG tablet Take 1 tablet (20 mg total) by mouth daily. 30 tablet 3   No current facility-administered medications for this visit.     REVIEW OF SYSTEMS:   Constitutional: Denies fevers, chills  Eyes: Denies blurriness of vision, double vision or watery eyes Ears, nose, mouth, throat, and face: Denies mucositis or sore throat (+) palpable lump in left throat Respiratory: Denies cough, dyspnea or wheezes Cardiovascular: Denies palpitation, chest discomfort or lower extremity swelling  Gastrointestinal:  Denies nausea, heartburn or change in bowel habits Skin: Denies abnormal skin rashes Lymphatics: Denies new lymphadenopathy or easy bruising Neurological:Denies numbness, tingling or new weaknesses Behavioral/Psych: Mood is stable, no new changes  Breast: (+) spontaneous sharp pains (+) itchiness secondary to radiation All other systems were reviewed with the patient and are negative.  PHYSICAL EXAMINATION: ECOG PERFORMANCE STATUS: 0 - Asymptomatic  Vitals:   04/03/17 0923  BP: (!) 131/98  Pulse: 64  Resp: 18  Temp: 99 F (37.2 C)  SpO2: 100%   Filed Weights   04/03/17 0923  Weight: 141 lb 12.8 oz (64.3 kg)    GENERAL:alert, no distress and comfortable SKIN: skin color, texture, turgor are normal, no rashes or significant lesions EYES: normal, conjunctiva are pink and non-injected, sclera clear OROPHARYNX:no exudate, no erythema and lips, buccal mucosa,  and tongue normal  NECK: supple, thyroid normal size, non-tender (+)  1 cm palpable  lymph node in the anterior upper neck, movable, tender  LYMPH:  no palpable lymphadenopathy in the cervical, axillary or inguinal LUNGS: clear to auscultation and percussion with normal breathing effort HEART: regular rate & rhythm and no murmurs and no lower extremity edema ABDOMEN:abdomen soft, non-tender and normal bowel sounds Musculoskeletal:no cyanosis of digits and no clubbing  PSYCH: alert & oriented x 3 with fluent speech NEURO: no focal motor/sensory deficits BILATERAL BREAST: bilateral mild erythema, surgical scar near the axilla  is healing well. No discharge  LABORATORY DATA:  I have reviewed the data as listed CBC Latest Ref Rng & Units 12/26/2016  WBC 3.9 - 10.3 10e3/uL 5.1  Hemoglobin 11.6 - 15.9 g/dL 12.9  Hematocrit 34.8 - 46.6 % 36.9  Platelets 145 - 400 10e3/uL 267    CMP Latest Ref Rng & Units 12/26/2016  Glucose 70 - 140 mg/dl 101  BUN 7.0 - 26.0 mg/dL 11.1  Creatinine 0.6 - 1.1 mg/dL 0.9  Sodium 136 - 145 mEq/L 138  Potassium 3.5 - 5.1 mEq/L 4.4  CO2 22 - 29 mEq/L 25  Calcium 8.4 - 10.4 mg/dL 9.1  Total Protein 6.4 - 8.3 g/dL 7.4  Total Bilirubin 0.20 - 1.20 mg/dL 0.50  Alkaline Phos 40 - 150 U/L 56  AST 5 - 34 U/L 20  ALT 0 - 55 U/L 18    PATHOLOGY  Diagnosis 12/18/16 Breast, left, needle core biopsy, 6:30 o'clock - INVASIVE DUCTAL CARCINOMA, GRADE I. Microscopic Comment Breast prognostic profile will be performed. Immunohistochemistry for calponin, smooth muscle myosin and p63 show a lack of basal cell staining supporting the diagnosis. 1 of 2 FINAL for Pamela Whitney, Pamela Whitney ([id number]) Microscopic Comment(continued) Dr. Melina Copa agrees. Called to [facility] on 12/19/16 and 12/20/16. PROGNOSTIC INDICATORS Results: IMMUNOHISTOCHEMICAL AND MORPHOMETRIC ANALYSIS PERFORMED MANUALLY Estrogen Receptor: 90%, POSITIVE, STRONG STAINING  INTENSITY Progesterone Receptor: 90%, POSITIVE, STRONG STAINING INTENSITY Proliferation Marker Ki67: 5% REFERENCE RANGE ESTROGEN RECEPTOR NEGATIVE 0% POSITIVE =>1% REFERENCE RANGE PROGESTERONE RECEPTOR NEGATIVE 0% POSITIVE =>1% All controls stained appropriately  Diagnosis 01/08/17 Breast, right, needle core biopsy, 9:30 o'clock - INVASIVE MAMMARY CARCINOMA - MAMMARY CARCINOMA IN-SITU - SEE COMMENT Microscopic Comment The biopsy material shows an infiltrative proliferation of cells with large vesicular nuclei with inconspicuous nucleoli, arranged linearly and in small clusters. Based on the biopsy, the carcinoma appears Nottingham grade 1 of 3 and measures 0.6 cm in greatest linear extent. E-cadherin and prognostic markers (ER/PR/ki-67/HER2-FISH) Results: HER2 - NEGATIVE RATIO OF HER2/CEP17 SIGNALS 1.29 AVERAGE HER2 COPY NUMBER PER CELL 2.20 Results: IMMUNOHISTOCHEMICAL AND MORPHOMETRIC ANALYSIS PERFORMED MANUALLY Estrogen Receptor: 40%, POSITIVE, STRONG STAINING INTENSITY Progesterone Receptor: 50%, POSITIVE, STRONG STAINING INTENSITY Proliferation Marker Ki67: 5%  Diagnosis 01/18/17 1. Breast, lumpectomy, Left - INVASIVE DUCTAL CARCINOMA, GRADE I/III, SPANNING 1.2 CM. - DUCTAL CARCINOMA IN SITU, LOW GRADE. - THE SURGICAL RESECTION MARGINS ARE NEGATIVE FOR CARCINOMA. - SEE ONCOLOGY TABLE BELOW. 2. Breast, excision, Left additional deep margin - FIBROCYSTIC CHANGES WITH ADENOSIS. - USUAL DUCTAL HYPERPLASIA. - THERE IS NO EVIDENCE OF MALIGNANCY. - SEE COMMENT. 3. Breast, lumpectomy, Right - LOBULAR NEOPLASIA (LOBULAR CARCINOMA IN SITU). - SEE COMMENT. 4. Breast, excision, Right additional Posterior Lateral - INVASIVE LOBULAR CARCINOMA, GRADE I/III, SPANNING, SPANNING 0.4 CM. - THE SURGICAL RESECTION MARGINS ARE NEGATIVE FOR CARCINOMA. - SEE ONCOLOGY TABLE BELOW. 5. Lymph node, sentinel, biopsy, Right Axillary - THERE IS NO EVIDENCE OF CARCINOMA IN 1 OF 1 LYMPH NODE  (0/1). - SEE COMMENT. 6. Lymph node, sentinel, biopsy, Left Axillary - THERE IS NO EVIDENCE OF CARCINOMA IN 1 OF 1 LYMPH NODE (0/1). - SEE COMMENT. 7. Breast, excision, Left additional Inferior Margin - FIBROCYSTIC CHANGES. - THERE IS NO EVIDENCE OF MALIGNANCY. - SEE COMMENT. Microscopic Comment 1. BREAST, INVASIVE TUMOR (LEFT BREAST) Procedure: Seed localized lumpectomy 1 of 5 FINAL for Pamela Whitney, Pamela Whitney ([id number]) Microscopic Comment(continued) Laterality: Left Tumor Size: 1.2 cm Histologic Type: Ductal Grade: I Tubular Differentiation: 2 Nuclear Pleomorphism: 1 Mitotic Count: 1 Ductal Carcinoma  in Situ (DCIS): Present, low grade Extent of Tumor: Confined to breast parenchyma Margins: Greater than 0.2 cm to all margins Regional Lymph Nodes: Number of Lymph Nodes Examined: 1 Number of Sentinel Lymph Nodes Examined: 1 Lymph Nodes with Macrometastases: 0 Lymph Nodes with Micrometastases: 0 Lymph Nodes with Isolated Tumor Cells: 0 Breast Prognostic Profile: [phone] Estrogen Receptor: 90%, strong Progesterone Receptor: 90%, strong Her2: No amplification was detected. The ratio was 1.38 Ki-67: 5% Best tumor block for sendout testing: 1A Pathologic Stage Classification (pTNM, AJCC 8th Edition): Primary Tumor (pT): pT1c Regional Lymph Nodes (pN): pN0 Distant Metastases (pM): pMX 2. , 4, 7. The surgical resection margin(s) of the specimen were inked and microscopically evaluated. 3. Invasive carcinoma is not identified in specimen 3. The surgical resection margin(s) of the specimen were inked and microscopically evaluated. 4. BREAST, INVASIVE TUMOR (RIGHT BREAST) Procedure: Seed localized lumpectomy and additional posterior/lateral margin of resection Laterality: Right Tumor Size: 0.4 cm (glass slide measurement) Histologic Type: Lobular Grade: I Tubular Differentiation: 2 Nuclear Pleomorphism: 1 Mitotic Count: 1 Ductal Carcinoma in Situ (DCIS): Not  identified Extent of Tumor: Confined to breast parenchyma Margins: Greater than 0.2 cm to all margins Regional Lymph Nodes: Number of Lymph Nodes Examined: 1 Number of Sentinel Lymph Nodes Examined: 1 Lymph Nodes with Macrometastases: 0 Lymph Nodes with Micrometastases: 0 Lymph Nodes with Isolated Tumor Cells: 0 Breast Prognostic Profile: Case [id number] Estrogen Receptor: 40%, strong Progesterone Receptor: 50%, strong 2 of 5 FINAL for Pamela Whitney, Pamela Whitney ([id number]) Microscopic Comment(continued) Her2: No amplification was detected. The ratio was 1.29 Ki-67: 5% Best tumor block for sendout testing: 4B, although limited Pathologic Stage Classification (pTNM, AJCC 8th Edition): Primary Tumor (pT): pT1a Regional Lymph Nodes (pN): pN0 Distant Metastases (pM): pMX Comment: The main right lumpectomy specimen shows lobular carcinoma in situ. There is a 0.4 cm focus of invasive lobular carcinoma present in specimen number 4. 5. , 6. Immunohistochemical stains performed on parts 5 and 6 fail to highlight the presence of cytokeratin positive tumor cells. (JBK:ecj 01/23/2017)    RADIOGRAPHIC STUDIES: I have personally reviewed the radiological images as listed and agreed with the findings in the report. No results found.    Diagnostic Mammogram 12/14/16 IMPRESSION: Suspicious irregular hypoechoic left breast mass, felt to correspond with mammographic focal architectural distortion.  Screening Mammogram 12/03/16 IMPRESSION: Suspicious irregular hypoechoic left breast mass, felt to correspond with mammographic focal architectural distortion.  ASSESSMENT & PLAN:  Pamela Whitney is a 52 y.o. caucasian female with no significant medical history.    1. 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 Donne Hazel on 01/18/17 with pathology results revealing a 1.2 cm invasive ductal carcinoma in the left breast and 0.4 cm invasive lobular carcinoma in the right breast.  One lymph nodes on each side were removed which were negative and there were clear margins of breast tissue.  -Her oncotype recurrence score is 14 and her 10-year distant recurrence on Tamoxifen is 10%. I discussed these results with her and how she would not benefit much from chemo.  ---Giving her strongly ER and PR positivity of the tumor cells, bilateral cancer, and her pre-menopause status, I recommend adjuvant endocrine therapy with tamoxifen. The potential side effects, which includes but not limited  to, hot flash, skin and vaginal dryness, slightly increased risk of cardiovascular disease and cataract, small risk of thrombosis and endometrial cancer, were discussed with her in great details. Preventive strategies for thrombosis, such as being physically active, using compression stocks, avoid cigarette smoking, etc., were reviewed with her. I also recommend her to follow-up with her gynecologist once a year, and watch for vaginal spotting or bleeding, as a clinically sign of endometrial cancer, etc. She voiced good understanding, and agrees to proceed. Will start after she completes adjuvant breast radiation. -Alternative adjuvant endocrine therapy, such as aromatase inhibitor and overexpression were discussed with her also. Given her age (54), I think the benefit is limited to, I did not strongly recommend her -We also reviewed the breast cancer surveillance, including annual mammogram, and physical exam.  -After lengthy discussion, she agrees to start tamoxifen in 2-3 weeks.  -F/u in 3 months, survivorship clinic in 6 months   2. Genetic Testing -although she has no family history of breast/ovarian cancer, due to her young age  she is interested in genetic testing, will refer her  -She agreed to proceed with testing today  -Her genetic testing results were negative with no pathogenic mutations (01/18/17)   PLAN:  -Start Tamoxifen 2 weeks after radiation completion  -Lab and f/u in 3 months, survivorship clinic in 6 months   No orders of the defined types were placed in this encounter.   All questions were answered. The patient knows to call the clinic with any problems, questions or concerns. I spent 20 minutes counseling the patient face to face. The total time spent in the appointment was 25 minutes and more than 50% was on counseling.     Truitt Merle, MD 04/03/2017 3:36 PM  This document serves as a record of services personally performed by Truitt Merle, MD. It was created on her behalf by Theresia Bough, a trained medical scribe. The creation of this record is based on the scribe's personal observations and the provider's statements to them.   I have reviewed the above documentation for accuracy and completeness, and I agree with the above.

## 2017-04-02 ENCOUNTER — Ambulatory Visit
Admission: RE | Admit: 2017-04-02 | Discharge: 2017-04-02 | Disposition: A | Discharge status: Home / Self Care | Payer: 59 | Source: Ambulatory Visit | Attending: Radiation Oncology | Admitting: Radiation Oncology

## 2017-04-02 DIAGNOSIS — Z510 Encounter for antineoplastic radiation therapy: Secondary | ICD-10-CM | POA: Diagnosis not present

## 2017-04-03 ENCOUNTER — Inpatient Hospital Stay: Payer: 59 | Attending: Hematology | Admitting: Hematology

## 2017-04-03 ENCOUNTER — Telehealth: Payer: Self-pay | Admitting: Hematology

## 2017-04-03 ENCOUNTER — Encounter: Payer: Self-pay | Admitting: Hematology

## 2017-04-03 ENCOUNTER — Ambulatory Visit
Admission: RE | Admit: 2017-04-03 | Discharge: 2017-04-03 | Disposition: A | Discharge status: Home / Self Care | Payer: 59 | Source: Ambulatory Visit | Attending: Radiation Oncology | Admitting: Radiation Oncology

## 2017-04-03 VITALS — BP 131/98 | HR 64 | Temp 99.0°F | Resp 18 | Ht 64.5 in | Wt 141.8 lb

## 2017-04-03 DIAGNOSIS — Z170 Estrogen receptor positive status [ER+]: Secondary | ICD-10-CM | POA: Diagnosis not present

## 2017-04-03 DIAGNOSIS — C50312 Malignant neoplasm of lower-inner quadrant of left female breast: Secondary | ICD-10-CM | POA: Diagnosis not present

## 2017-04-03 DIAGNOSIS — Z510 Encounter for antineoplastic radiation therapy: Secondary | ICD-10-CM | POA: Diagnosis not present

## 2017-04-03 MED ORDER — TAMOXIFEN CITRATE 20 MG PO TABS: 20.0000 mg | ORAL_TABLET | Freq: Every day | ORAL | 3 refills | Status: DC

## 2017-04-03 NOTE — Telephone Encounter (Signed)
Scheduled appt per 1/30 los - Pt is aware of appt - no printed wanted per patient request.

## 2017-04-04 ENCOUNTER — Ambulatory Visit
Admission: RE | Admit: 2017-04-04 | Discharge: 2017-04-04 | Disposition: A | Discharge status: Home / Self Care | Payer: 59 | Source: Ambulatory Visit | Attending: Radiation Oncology | Admitting: Radiation Oncology

## 2017-04-04 DIAGNOSIS — Z510 Encounter for antineoplastic radiation therapy: Secondary | ICD-10-CM | POA: Diagnosis not present

## 2017-04-05 ENCOUNTER — Ambulatory Visit
Admission: RE | Admit: 2017-04-05 | Discharge: 2017-04-05 | Disposition: A | Discharge status: Home / Self Care | Payer: 59 | Source: Ambulatory Visit | Attending: Radiation Oncology | Admitting: Radiation Oncology

## 2017-04-05 DIAGNOSIS — Z510 Encounter for antineoplastic radiation therapy: Secondary | ICD-10-CM | POA: Insufficient documentation

## 2017-04-05 DIAGNOSIS — C50212 Malignant neoplasm of upper-inner quadrant of left female breast: Secondary | ICD-10-CM | POA: Diagnosis present

## 2017-04-05 DIAGNOSIS — Z170 Estrogen receptor positive status [ER+]: Secondary | ICD-10-CM | POA: Insufficient documentation

## 2017-04-06 ENCOUNTER — Ambulatory Visit: Payer: 59

## 2017-04-08 ENCOUNTER — Ambulatory Visit
Admission: RE | Admit: 2017-04-08 | Discharge: 2017-04-08 | Disposition: A | Discharge status: Home / Self Care | Payer: 59 | Source: Ambulatory Visit | Attending: Radiation Oncology | Admitting: Radiation Oncology

## 2017-04-08 ENCOUNTER — Encounter: Payer: Self-pay | Admitting: Radiation Oncology

## 2017-04-08 DIAGNOSIS — C50212 Malignant neoplasm of upper-inner quadrant of left female breast: Secondary | ICD-10-CM | POA: Diagnosis not present

## 2017-04-09 ENCOUNTER — Ambulatory Visit: Payer: 59

## 2017-04-10 ENCOUNTER — Ambulatory Visit: Payer: 59

## 2017-04-11 ENCOUNTER — Ambulatory Visit: Payer: 59

## 2017-04-11 NOTE — Progress Notes (Signed)
  Radiation Oncology         [phone] ________________________________  Name: Pamela Whitney MRN: [id number]  Date: 04/08/2017  DOB: 1965-06-16  End of Treatment Note  Diagnosis:   52 y.o. female with Stage IA pT1cN0M0 ER/PR positive, grade 1 invasive ductal carcinoma of the left breast and Stage IA pT1aN0M0, ER/PR positive, grade 1 invasive lobular carcinoma and LCIS of the right breast   Indication for treatment:  Curative       Radiation treatment dates:   03/12/2017 - 04/08/2017  Site/dose:   The patient initially received a dose of 42.5 Gy in 17 fractions to the bilateral breasts using whole-breast tangent fields. This was delivered using a 3-D conformal technique. The patient then received a boost to the seroma cavities. This delivered an additional 7.5 Gy in 3 fractions using a 3 field photon technique due to the depth of the seromas. The total dose was 50 Gy.  Narrative: The patient tolerated radiation treatment relatively well.   The patient had some expected skin irritation as she progressed during treatment. Moist desquamation was not present at the end of treatment.  Plan: The patient has completed radiation treatment. The patient will return to radiation oncology clinic for routine followup in one month. I advised the patient to call or return sooner if they have any questions or concerns related to their recovery or treatment. ________________________________  Jodelle Gross, MD, PhD  This document serves as a record of services personally performed by Kyung Rudd, MD. It was created on his behalf by Rae Lips, a trained medical scribe. The creation of this record is based on the scribe's personal observations and the provider's statements to them. This document has been checked and approved by the attending provider.

## 2017-04-12 ENCOUNTER — Ambulatory Visit: Payer: 59

## 2017-04-13 ENCOUNTER — Ambulatory Visit: Payer: 59

## 2017-05-07 ENCOUNTER — Ambulatory Visit: Payer: Self-pay | Admitting: Radiation Oncology

## 2017-05-13 ENCOUNTER — Ambulatory Visit
Admission: RE | Admit: 2017-05-13 | Discharge: 2017-05-13 | Disposition: A | Discharge status: Home / Self Care | Payer: 59 | Source: Ambulatory Visit | Attending: Radiation Oncology | Admitting: Radiation Oncology

## 2017-05-13 ENCOUNTER — Encounter: Payer: Self-pay | Admitting: Radiation Oncology

## 2017-05-13 VITALS — BP 136/90 | HR 79 | Temp 98.7°F | Resp 18 | Ht 64.5 in | Wt 140.0 lb

## 2017-05-13 DIAGNOSIS — Z923 Personal history of irradiation: Secondary | ICD-10-CM | POA: Insufficient documentation

## 2017-05-13 DIAGNOSIS — C50411 Malignant neoplasm of upper-outer quadrant of right female breast: Secondary | ICD-10-CM

## 2017-05-13 DIAGNOSIS — C50911 Malignant neoplasm of unspecified site of right female breast: Secondary | ICD-10-CM | POA: Diagnosis present

## 2017-05-13 DIAGNOSIS — Z880 Allergy status to penicillin: Secondary | ICD-10-CM | POA: Insufficient documentation

## 2017-05-13 DIAGNOSIS — Z170 Estrogen receptor positive status [ER+]: Secondary | ICD-10-CM | POA: Diagnosis present

## 2017-05-13 DIAGNOSIS — C50312 Malignant neoplasm of lower-inner quadrant of left female breast: Secondary | ICD-10-CM

## 2017-05-13 NOTE — Addendum Note (Signed)
Encounter addended by: Malena Edman, RN on: 05/13/2017 9:23 AM  Actions taken: Charge Capture section accepted

## 2017-05-13 NOTE — Progress Notes (Signed)
  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 This was delivered using a 3-D conformal technique. The patient then received a boost to the seroma cavities. This delivered an additional 7.5 Gy in 3 fractions using a 3 field photon technique due to the depth of the seromas. The total dose was 50 Gy   Narrative:  The patient returns today for routine follow-up. During treatment she did very well with radiotherapy and did not have significant desquamation.                             On review of systems, the patient states she's doing great and is tolerating tamoxifen. She denies any concerns with her skin at this time.  ALLERGIES:  is allergic to penicillins.  Meds: Current Outpatient Medications  Medication Sig Dispense Refill  . ibuprofen (ADVIL,MOTRIN) 200 MG tablet Take 200 mg every 6 (six) hours as needed by mouth.    . tamoxifen (NOLVADEX) 20 MG tablet Take 1 tablet (20 mg total) by mouth daily. 30 tablet 3   No current facility-administered medications for this encounter.     Physical Findings:  height is 5' 4.5" (1.638 m) and weight is 140 lb (63.5 kg). Her oral temperature is 98.7 F (37.1 C). Her blood pressure is 136/90 and her pulse is 79. Her respiration is 18 and oxygen saturation is 98%.  Pain Assessment Pain Score: 0-No pain/10 In general this is a well appearing caucasian in no acute  distress. She's alert and oriented x4 and appropriate throughout the examination. Cardiopulmonary assessment is negative for acute distress and she exhibits normal effort. The bilateral breasts were examined and reveal mild hyperpigmentation without desquamation.   Lab Findings: Lab Results  Component Value Date   WBC 5.1 12/26/2016   HGB 12.9 12/26/2016   HCT 36.9 12/26/2016   MCV 93.5 12/26/2016   PLT 267 12/26/2016     Radiographic Findings: No results found.  Impression/Plan: 1. 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 The patient has been doing well since completion of radiotherapy. We discussed that we would be happy to continue to follow her as needed, but she will also continue to follow up with Dr. Burr Medico in medical oncology. She was counseled on skin care as well as measures to avoid sun exposure to this area.  2. Survivorship. The patient was counseled on the survivorship appointments she will be scheduled for per Dr. Burr Medico. We reviewed activities offered here at the cancer center and gave her a copy of the monthly calendar.     Carola Rhine, PAC

## 2017-06-21 NOTE — Progress Notes (Signed)
[facility]  Telephone:([phone]) [phone] [phone]  Clinic Follow Up Note   Patient Care Team: Olga Millers, MD as PCP - General (Obstetrics and Gynecology) 06/24/2017  CHIEF COMPLAINTS:  Malignant neoplasm of lower-inner quadrant of left breast in female, estrogen receptor positive  Oncology History   Cancer Staging Carcinoma of upper-outer quadrant of right breast in female, estrogen receptor positive ([place]) Staging form: Breast, AJCC 8th Edition - Pathologic stage from 01/18/2017: Stage IA (pT1a, pN0, cM0, G1, ER+, PR+, HER2-) - Signed by Truitt Merle, MD on 06/24/2017 - Clinical: Stage IA (cT1a, cN0(sn), cM0, G1, ER: Positive, PR: Positive, HER2: Negative) - Unsigned  Malignant neoplasm of lower-inner quadrant of left breast in female, estrogen receptor positive ([place]) Staging form: Breast, AJCC 8th Edition - Pathologic stage from 01/18/2017: Stage IA (pT1c, pN0(sn), cM0, G1, ER+, PR+, HER2-, Oncotype DX score: 14) - Signed by Truitt Merle, MD on 06/24/2017       Malignant neoplasm of lower-inner quadrant of left breast in female, estrogen receptor positive ([place])   12/14/2016 Mammogram    Diagnostic mammogram 12/14/16 IMPRESSION: Suspicious 9 x 7 x 9 mm irregular lobular hypoechoic left breast mass at the 630 o'clock 3 cm from the nipple, felt to correspond with mammographic focal architectural distortion. RECOMMENDATION: Ultrasound-guided core needle biopsy left breast mass.      12/18/2016 Initial Biopsy    Diagnosis 12/18/16 Breast, left, needle core biopsy, 6:30 o'clock - INVASIVE DUCTAL CARCINOMA, GRADE I.       12/18/2016 Receptors her2    Estrogen Receptor: 90%, POSITIVE, STRONG STAINING INTENSITY Progesterone Receptor: 90%, POSITIVE, STRONG STAINING INTENSITY Proliferation Marker Ki67: 5% HER2 NEGATIVE       12/20/2016 Initial Diagnosis    Malignant neoplasm of lower-inner quadrant of left breast in female, estrogen receptor positive  ([place])      01/07/2017 Genetic Testing    Patient had genetic testing due to a personal history of breeast cancer and a family history of prostate and colon cancer. The Common Hereditary Cancers Panel + Prostate Cancer Panelwas ordered. The Hereditary Gene Panel offered by [facility] includes sequencing and/or deletion duplication testing of the following 48 genes: APC, ATM, AXIN2, BARD1, BMPR1A, BRCA1, BRCA2, BRIP1, CDH1, CDKN2A (p14ARF), CDKN2A (p16INK4a), CKD4, CHEK2, CTNNA1, DICER1, EPCAM (Deletion/duplication testing only), FANCA, GREM1 (promoter region deletion/duplication testing only), KIT, MEN1, MLH1, MSH2, MSH3, MSH6, MUTYH, NBN, NF1, NHTL1, PALB2, PDGFRA, PMS2, POLD1, POLE, PTEN, RAD50, RAD51C, RAD51D, SDHB, SDHC, SDHD, SMAD4, SMARCA4. STK11, TP53, TSC1, TSC2, and VHL.  The following genes were evaluated for sequence changes only: SDHA and HOXB13 c.251G>A variant only.  Results- Negative, no pathogenic variants identified.  The date of this test report is 01/07/2017      01/18/2017 Surgery    Bilateral Breast lumpectomy with Dr. Donne Hazel       01/18/2017 Pathology Results     Diagnosis 01/18/17 1. Breast, lumpectomy, Left - INVASIVE DUCTAL CARCINOMA, GRADE I/III, SPANNING 1.2 CM. - DUCTAL CARCINOMA IN SITU, LOW GRADE. - THE SURGICAL RESECTION MARGINS ARE NEGATIVE FOR CARCINOMA. - SEE ONCOLOGY TABLE BELOW. 2. Breast, excision, Left additional deep margin - FIBROCYSTIC CHANGES WITH ADENOSIS. - USUAL DUCTAL HYPERPLASIA. - THERE IS NO EVIDENCE OF MALIGNANCY. - SEE COMMENT. 3. Breast, lumpectomy, Right - LOBULAR NEOPLASIA (LOBULAR CARCINOMA IN SITU). - SEE COMMENT. 4. Breast, excision, Right additional Posterior Lateral - INVASIVE LOBULAR CARCINOMA, GRADE I/III, SPANNING, SPANNING 0.4 CM. - THE SURGICAL RESECTION MARGINS ARE NEGATIVE FOR CARCINOMA. - SEE ONCOLOGY TABLE BELOW. 5.  Lymph node, sentinel, biopsy, Right Axillary - THERE IS NO EVIDENCE OF CARCINOMA IN 1 OF 1 LYMPH NODE  (0/1). - SEE COMMENT. 6. Lymph node, sentinel, biopsy, Left Axillary - THERE IS NO EVIDENCE OF CARCINOMA IN 1 OF 1 LYMPH NODE (0/1). - SEE COMMENT. 7. Breast, excision, Left additional Inferior Margin - FIBROCYSTIC CHANGES. - THERE IS NO EVIDENCE OF MALIGNANCY. - SEE COMMENT.       01/18/2017 Oncotype testing    Her oncotype recurrence score is 14 and her distant recurrence on Tamoxifen is 10%.      03/12/2017 - 04/08/2017 Radiation Therapy    Radiation therapy with Dr. Lisbeth Renshaw      04/21/2017 -  Anti-estrogen oral therapy    20 mg Tamoxifen daily        Carcinoma of upper-outer quadrant of right breast in female, estrogen receptor positive ([place])   01/18/2017 Cancer Staging    Staging form: Breast, AJCC 8th Edition - Pathologic stage from 01/18/2017: Stage IA (pT1a, pN0, cM0, G1, ER+, PR+, HER2-) - Signed by Truitt Merle, MD on 06/24/2017      02/12/2017 Initial Diagnosis    Carcinoma of upper-outer quadrant of right breast in female, estrogen receptor positive ([place])       HISTORY OF PRESENTING ILLNESS: 12/26/16 Pamela Whitney 52 y.o. female is here because of newly diagnosed left breast cancer. She presents to breast clinic today with her husband.   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 She had rhinoplasty at 52 yo and 2 C-sections. Her mother and maternal grandmother had colon cancer. She smoked on and off for about 15 years, half a pack a week.   Today she notes her lump was found by screening mammogram. She denies any breast change, pain or fatigue. This year her periods have becomes less regular. She works in [employer], applications. She notes for a few months she started having hot flashes and night sweats, but homone test was done and she is likely pre or perimenopausal. In the past 6 months she has felt a small lump in the left side of her lump. She notices her fingers are swelling but no pain.    GYN HISTORY  Menarchal: 16 LMP:  12/12/16 Contraceptive: for 20 years, stopped 10 years ago.  HRT: NA GP: G3P2A1, miscarriage, first birth at 6. Has 2 girls  CURRENT THERAPY: Tamoxifen 20 mg daily started on 04/21/17  INTERM HISTORY: RODOLFO NOTARO is here for follow up after starting Tamoxifen. She presents to the clinic today by herself. She reports she is doing well overall. She is compliant with Tamoxifen and reports at first she had trouble sleeping due to overheating and restlessness. She states it is improving. She also reports she's had some sharp pains near her surgical site. She states she has not had a period since she started Tamoxifen 2 months ago.   On review of systems, pt denies any other complaints at this time. Pertinent positives are listed and detailed within the above HPI.   MEDICAL HISTORY:  Past Medical History:  Diagnosis Date  . Allergy    PCNS  . Breast cancer ([place]) 01/2017   Bilateral  . Family history of colon cancer   . Family history of prostate cancer in father     SURGICAL HISTORY: Past Surgical History:  Procedure Laterality Date  . CESAREAN SECTION    . RADIOACTIVE SEED GUIDED PARTIAL MASTECTOMY WITH AXILLARY SENTINEL  LYMPH NODE BIOPSY Bilateral 01/18/2017   Procedure: BILATERAL BREAST LUMPECTOMY WITH  BILATERAL RADIOACTIVE SEED AND BILATERAL SENTINEL LYMPH NODE BIOPSY;  Surgeon: Rolm Bookbinder, MD;  Location: [facility];  Service: General;  Laterality: Bilateral;  . RHINOPLASTY    . WISDOM TOOTH EXTRACTION      SOCIAL HISTORY: Social History   Socioeconomic History  . Marital status: Married    Spouse name: Not on file  . Number of children: Not on file  . Years of education: Not on file  . Highest education level: Not on file  Occupational History  . Not on file  Social Needs  . Financial resource strain: Not on file  . Food insecurity:    Worry: Not on file    Inability: Not on file  . Transportation needs:    Medical: Not on file     Non-medical: Not on file  Tobacco Use  . Smoking status: Former Smoker    Packs/day: 0.10    Years: 15.00    Pack years: 1.50    Types: Cigarettes    Last attempt to quit: 12/26/2001    Years since quitting: 15.5  . Smokeless tobacco: Never Used  Substance and Sexual Activity  . Alcohol use: Yes    Alcohol/week: 0.6 oz    Types: 1 Cans of beer per week    Comment: 3-5 times a week   . Drug use: No  . Sexual activity: Yes    Birth control/protection: Surgical    Comment: husband has had vasectomy  Lifestyle  . Physical activity:    Days per week: Not on file    Minutes per session: Not on file  . Stress: Not on file  Relationships  . Social connections:    Talks on phone: Not on file    Gets together: Not on file    Attends religious service: Not on file    Active member of club or organization: Not on file    Attends meetings of clubs or organizations: Not on file    Relationship status: Not on file  . Intimate partner violence:    Fear of current or ex partner: Not on file    Emotionally abused: Not on file    Physically abused: Not on file    Forced sexual activity: Not on file  Other Topics Concern  . Not on file  Social History Narrative  . Not on file    FAMILY HISTORY: Family History  Problem Relation Age of Onset  . Colon cancer Mother 38  . Prostate cancer Father 61       prostectomy, no radiation  . Colon cancer Maternal Grandmother 73  . Stroke Paternal Grandfather   . HIV Paternal Uncle   . Breast cancer Neg Hx     ALLERGIES:  is allergic to penicillins.  MEDICATIONS:  Current Outpatient Medications  Medication Sig Dispense Refill  . ibuprofen (ADVIL,MOTRIN) 200 MG tablet Take 200 mg every 6 (six) hours as needed by mouth.    . tamoxifen (NOLVADEX) 20 MG tablet Take 1 tablet (20 mg total) by mouth daily. 30 tablet 3   No current facility-administered medications for this visit.     REVIEW OF SYSTEMS:   Constitutional: Denies fevers, chills  (+) hot flash (+) insomnia  Eyes: Denies blurriness of vision, double vision or watery eyes Ears, nose, mouth, throat, and face: Denies mucositis or sore throat  Respiratory: Denies cough, dyspnea or wheezes Cardiovascular: Denies palpitation, chest discomfort  or lower extremity swelling  Gastrointestinal:  Denies nausea, heartburn or change in bowel habits Skin: Denies abnormal skin rashes Lymphatics: Denies new lymphadenopathy or easy bruising Neurological:Denies numbness, tingling or new weaknesses Behavioral/Psych: Mood is stable, no new changes  Breast: (+) spontaneous sharp pains  All other systems were reviewed with the patient and are negative.  PHYSICAL EXAMINATION: ECOG PERFORMANCE STATUS: 0 - Asymptomatic  Vitals:   06/24/17 1546  BP: 114/67  Pulse: 72  Resp: 18  Temp: 98.9 F (37.2 C)  SpO2: 100%   Filed Weights   06/24/17 1546  Weight: 139 lb 14.4 oz (63.5 kg)    GENERAL:alert, no distress and comfortable SKIN: skin color, texture, turgor are normal, no rashes or significant lesions EYES: normal, conjunctiva are pink and non-injected, sclera clear OROPHARYNX:no exudate, no erythema and lips, buccal mucosa, and tongue normal  NECK: supple, thyroid normal size, non-tender (+)  1 cm palpable  lymph node in the anterior upper neck, movable, tender  LYMPH:  no palpable lymphadenopathy in the cervical, axillary or inguinal LUNGS: clear to auscultation and percussion with normal breathing effort HEART: regular rate & rhythm and no murmurs and no lower extremity edema ABDOMEN:abdomen soft, non-tender and normal bowel sounds Musculoskeletal:no cyanosis of digits and no clubbing  PSYCH: alert & oriented x 3 with fluent speech NEURO: no focal motor/sensory deficits BILATERAL BREAST: bilateral breast status post lumpectomy, surgical scars near the axilla have healed well. No palpable breast mass or adenopathy.  LABORATORY DATA:  I have reviewed the data as listed CBC  Latest Ref Rng & Units 06/24/2017 12/26/2016  WBC 3.9 - 10.3 K/uL 6.1 5.1  Hemoglobin 11.6 - 15.9 g/dL 12.5 12.9  Hematocrit 34.8 - 46.6 % 36.9 36.9  Platelets 145 - 400 K/uL 244 267    CMP Latest Ref Rng & Units 06/24/2017 12/26/2016  Glucose 70 - 140 mg/dL 104 101  BUN 7 - 26 mg/dL 15 11.1  Creatinine 0.60 - 1.10 mg/dL 1.08 0.9  Sodium 136 - 145 mmol/L 140 138  Potassium 3.5 - 5.1 mmol/L 4.4 4.4  Chloride 98 - 109 mmol/L 106 -  CO2 22 - 29 mmol/L 25 25  Calcium 8.4 - 10.4 mg/dL 9.4 9.1  Total Protein 6.4 - 8.3 g/dL 7.5 7.4  Total Bilirubin 0.2 - 1.2 mg/dL 0.2 0.50  Alkaline Phos 40 - 150 U/L 58 56  AST 5 - 34 U/L 17 20  ALT 0 - 55 U/L 15 18    PATHOLOGY  Diagnosis 12/18/16 Breast, left, needle core biopsy, 6:30 o'clock - INVASIVE DUCTAL CARCINOMA, GRADE I. Microscopic Comment Breast prognostic profile will be performed. Immunohistochemistry for calponin, smooth muscle myosin and p63 show a lack of basal cell staining supporting the diagnosis. 1 of 2 FINAL for LANDRY, LOOKINGBILL ([id number]) Microscopic Comment(continued) Dr. Melina Copa agrees. Called to [facility] on 12/19/16 and 12/20/16. PROGNOSTIC INDICATORS Results: IMMUNOHISTOCHEMICAL AND MORPHOMETRIC ANALYSIS PERFORMED MANUALLY Estrogen Receptor: 90%, POSITIVE, STRONG STAINING INTENSITY Progesterone Receptor: 90%, POSITIVE, STRONG STAINING INTENSITY Proliferation Marker Ki67: 5% REFERENCE RANGE ESTROGEN RECEPTOR NEGATIVE 0% POSITIVE =>1% REFERENCE RANGE PROGESTERONE RECEPTOR NEGATIVE 0% POSITIVE =>1% All controls stained appropriately  Diagnosis 01/08/17 Breast, right, needle core biopsy, 9:30 o'clock - INVASIVE MAMMARY CARCINOMA - MAMMARY CARCINOMA IN-SITU - SEE COMMENT Microscopic Comment The biopsy material shows an infiltrative proliferation of cells with large vesicular nuclei with inconspicuous nucleoli, arranged linearly and in small clusters. Based on the biopsy, the carcinoma  appears Nottingham grade 1 of 3  and measures 0.6 cm in greatest linear extent. E-cadherin and prognostic markers (ER/PR/ki-67/HER2-FISH) Results: HER2 - NEGATIVE RATIO OF HER2/CEP17 SIGNALS 1.29 AVERAGE HER2 COPY NUMBER PER CELL 2.20 Results: IMMUNOHISTOCHEMICAL AND MORPHOMETRIC ANALYSIS PERFORMED MANUALLY Estrogen Receptor: 40%, POSITIVE, STRONG STAINING INTENSITY Progesterone Receptor: 50%, POSITIVE, STRONG STAINING INTENSITY Proliferation Marker Ki67: 5%  Diagnosis 01/18/17 1. Breast, lumpectomy, Left - INVASIVE DUCTAL CARCINOMA, GRADE I/III, SPANNING 1.2 CM. - DUCTAL CARCINOMA IN SITU, LOW GRADE. - THE SURGICAL RESECTION MARGINS ARE NEGATIVE FOR CARCINOMA. - SEE ONCOLOGY TABLE BELOW. 2. Breast, excision, Left additional deep margin - FIBROCYSTIC CHANGES WITH ADENOSIS. - USUAL DUCTAL HYPERPLASIA. - THERE IS NO EVIDENCE OF MALIGNANCY. - SEE COMMENT. 3. Breast, lumpectomy, Right - LOBULAR NEOPLASIA (LOBULAR CARCINOMA IN SITU). - SEE COMMENT. 4. Breast, excision, Right additional Posterior Lateral - INVASIVE LOBULAR CARCINOMA, GRADE I/III, SPANNING, SPANNING 0.4 CM. - THE SURGICAL RESECTION MARGINS ARE NEGATIVE FOR CARCINOMA. - SEE ONCOLOGY TABLE BELOW. 5. Lymph node, sentinel, biopsy, Right Axillary - THERE IS NO EVIDENCE OF CARCINOMA IN 1 OF 1 LYMPH NODE (0/1). - SEE COMMENT. 6. Lymph node, sentinel, biopsy, Left Axillary - THERE IS NO EVIDENCE OF CARCINOMA IN 1 OF 1 LYMPH NODE (0/1). - SEE COMMENT. 7. Breast, excision, Left additional Inferior Margin - FIBROCYSTIC CHANGES. - THERE IS NO EVIDENCE OF MALIGNANCY. - SEE COMMENT. Microscopic Comment 1. BREAST, INVASIVE TUMOR (LEFT BREAST) Procedure: Seed localized lumpectomy 1 of 5 FINAL for PATSEY, PITSTICK ([id number]) Microscopic Comment(continued) Laterality: Left Tumor Size: 1.2 cm Histologic Type: Ductal Grade: I Tubular Differentiation: 2 Nuclear Pleomorphism: 1 Mitotic Count: 1 Ductal Carcinoma in Situ  (DCIS): Present, low grade Extent of Tumor: Confined to breast parenchyma Margins: Greater than 0.2 cm to all margins Regional Lymph Nodes: Number of Lymph Nodes Examined: 1 Number of Sentinel Lymph Nodes Examined: 1 Lymph Nodes with Macrometastases: 0 Lymph Nodes with Micrometastases: 0 Lymph Nodes with Isolated Tumor Cells: 0 Breast Prognostic Profile: [phone] Estrogen Receptor: 90%, strong Progesterone Receptor: 90%, strong Her2: No amplification was detected. The ratio was 1.38 Ki-67: 5% Best tumor block for sendout testing: 1A Pathologic Stage Classification (pTNM, AJCC 8th Edition): Primary Tumor (pT): pT1c Regional Lymph Nodes (pN): pN0 Distant Metastases (pM): pMX 2. , 4, 7. The surgical resection margin(s) of the specimen were inked and microscopically evaluated. 3. Invasive carcinoma is not identified in specimen 3. The surgical resection margin(s) of the specimen were inked and microscopically evaluated. 4. BREAST, INVASIVE TUMOR (RIGHT BREAST) Procedure: Seed localized lumpectomy and additional posterior/lateral margin of resection Laterality: Right Tumor Size: 0.4 cm (glass slide measurement) Histologic Type: Lobular Grade: I Tubular Differentiation: 2 Nuclear Pleomorphism: 1 Mitotic Count: 1 Ductal Carcinoma in Situ (DCIS): Not identified Extent of Tumor: Confined to breast parenchyma Margins: Greater than 0.2 cm to all margins Regional Lymph Nodes: Number of Lymph Nodes Examined: 1 Number of Sentinel Lymph Nodes Examined: 1 Lymph Nodes with Macrometastases: 0 Lymph Nodes with Micrometastases: 0 Lymph Nodes with Isolated Tumor Cells: 0 Breast Prognostic Profile: Case [id number] Estrogen Receptor: 40%, strong Progesterone Receptor: 50%, strong 2 of 5 FINAL for LAETITIA, SCHNEPF ([id number]) Microscopic Comment(continued) Her2: No amplification was detected. The ratio was 1.29 Ki-67: 5% Best tumor block for sendout testing: 4B, although  limited Pathologic Stage Classification (pTNM, AJCC 8th Edition): Primary Tumor (pT): pT1a Regional Lymph Nodes (pN): pN0 Distant Metastases (pM): pMX Comment: The main right lumpectomy specimen shows lobular carcinoma in situ. There is a 0.4 cm focus of invasive lobular carcinoma present in specimen  number 4. 5. , 6. Immunohistochemical stains performed on parts 5 and 6 fail to highlight the presence of cytokeratin positive tumor cells. (JBK:ecj 01/23/2017)    RADIOGRAPHIC STUDIES: I have personally reviewed the radiological images as listed and agreed with the findings in the report. No results found.    Diagnostic Mammogram 12/14/16 IMPRESSION: Suspicious irregular hypoechoic left breast mass, felt to correspond with mammographic focal architectural distortion.  Screening Mammogram 12/03/16 IMPRESSION: Suspicious irregular hypoechoic left breast mass, felt to correspond with mammographic focal architectural distortion.  ASSESSMENT & PLAN:  Pamela Whitney is a 52 y.o. caucasian female with no significant medical history.    1. 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 Donne Hazel on 01/18/17 with pathology results revealing a 1.2 cm invasive ductal carcinoma in the left breast and 0.4 cm invasive lobular carcinoma in the right breast.  One lymph nodes on each side were removed which were negative and there were clear margins of breast tissue.  -Her oncotype recurrence score is 14 and her 10-year distant recurrence on Tamoxifen is 10%. I previously discussed these results with her and how she would not benefit much from chemo.  -previously offered survivorship clinic and she declined  -She  started Tamoxifen on 04/21/17 and has been tolerating moderately well. She is clinically doing well. Labs reviewed, CBC and CMP are WNL.  -Clinical exam was negative, no clinical concern for recurrence. - She will be due for a mammogram in October 2019. She is breast density C, we discussed the option of Breast MRI in the future -F/u in 4 months   2. Genetic Testing -although she has no family history of breast/ovarian cancer, due to her young age she is interested in genetic testing, will refer her  -Her genetic testing results were negative with no pathogenic mutations (01/18/17)  3. Insomnia from restlessness -She reports she starting having trouble sleeping once she started taking Tamoxifen, partly due to hot flash but more due to restlessness. -I advised her to try exercising and she can try Melatonin    PLAN:  -Continue Tamoxifen  -Mammogram in Oct 2019 -Lab and f/u In 4 months   No orders of the defined types were placed in this encounter.   All questions were answered. The patient knows to call the clinic with any problems, questions or concerns. I spent 15 minutes counseling the patient face to face. The total time spent in the appointment was 20 minutes and more than 50% was on counseling.  This document serves as a record of services personally performed by Truitt Merle, MD. It was created on her behalf by Theresia Bough, a trained medical scribe. The creation of this record is based on the scribe's personal observations and the provider's statements to them.   I have reviewed the above documentation for accuracy and completeness, and I agree with the above.     Truitt Merle, MD 06/24/2017

## 2017-06-23 ENCOUNTER — Other Ambulatory Visit: Payer: Self-pay | Admitting: Hematology

## 2017-06-23 DIAGNOSIS — Z170 Estrogen receptor positive status [ER+]: Principal | ICD-10-CM

## 2017-06-23 DIAGNOSIS — C50411 Malignant neoplasm of upper-outer quadrant of right female breast: Secondary | ICD-10-CM

## 2017-06-24 ENCOUNTER — Telehealth: Payer: Self-pay

## 2017-06-24 ENCOUNTER — Inpatient Hospital Stay: Payer: 59 | Attending: Hematology | Admitting: Hematology

## 2017-06-24 ENCOUNTER — Encounter: Payer: Self-pay | Admitting: Hematology

## 2017-06-24 ENCOUNTER — Inpatient Hospital Stay: Payer: 59

## 2017-06-24 ENCOUNTER — Other Ambulatory Visit: Payer: Self-pay | Admitting: Hematology

## 2017-06-24 VITALS — BP 114/67 | HR 72 | Temp 98.9°F | Resp 18 | Ht 64.5 in | Wt 139.9 lb

## 2017-06-24 DIAGNOSIS — C50312 Malignant neoplasm of lower-inner quadrant of left female breast: Secondary | ICD-10-CM | POA: Diagnosis not present

## 2017-06-24 DIAGNOSIS — Z79810 Long term (current) use of selective estrogen receptor modulators (SERMs): Secondary | ICD-10-CM | POA: Diagnosis not present

## 2017-06-24 DIAGNOSIS — Z170 Estrogen receptor positive status [ER+]: Secondary | ICD-10-CM | POA: Diagnosis not present

## 2017-06-24 DIAGNOSIS — C50411 Malignant neoplasm of upper-outer quadrant of right female breast: Secondary | ICD-10-CM

## 2017-06-24 DIAGNOSIS — G4700 Insomnia, unspecified: Secondary | ICD-10-CM | POA: Insufficient documentation

## 2017-06-24 LAB — CMP (CANCER CENTER ONLY)
ALK PHOS: 58 U/L (ref 40–150)
ALT: 15 U/L (ref 0–55)
AST: 17 U/L (ref 5–34)
Albumin: 3.8 g/dL (ref 3.5–5.0)
Anion gap: 9 (ref 3–11)
BUN: 15 mg/dL (ref 7–26)
CALCIUM: 9.4 mg/dL (ref 8.4–10.4)
CO2: 25 mmol/L (ref 22–29)
CREATININE: 1.08 mg/dL (ref 0.60–1.10)
Chloride: 106 mmol/L (ref 98–109)
GFR, EST NON AFRICAN AMERICAN: 59 mL/min — AB (ref >60)
Glucose, Bld: 104 mg/dL (ref 70–140)
Potassium: 4.4 mmol/L (ref 3.5–5.1)
Sodium: 140 mmol/L (ref 136–145)
Total Bilirubin: 0.2 mg/dL (ref 0.2–1.2)
Total Protein: 7.5 g/dL (ref 6.4–8.3)

## 2017-06-24 LAB — CBC WITH DIFFERENTIAL (CANCER CENTER ONLY)
BASOS ABS: 0 10*3/uL (ref 0.0–0.1)
Basophils Relative: 1 %
Eosinophils Absolute: 0.1 10*3/uL (ref 0.0–0.5)
Eosinophils Relative: 1 %
HCT: 36.9 % (ref 34.8–46.6)
Hemoglobin: 12.5 g/dL (ref 11.6–15.9)
LYMPHS ABS: 1.6 10*3/uL (ref 0.9–3.3)
LYMPHS PCT: 27 %
MCH: 31.5 pg (ref 25.1–34.0)
MCHC: 34 g/dL (ref 31.5–36.0)
MCV: 92.8 fL (ref 79.5–101.0)
Monocytes Absolute: 0.5 10*3/uL (ref 0.1–0.9)
Monocytes Relative: 7 %
NEUTROS PCT: 64 %
Neutro Abs: 3.9 10*3/uL (ref 1.5–6.5)
Platelet Count: 244 10*3/uL (ref 145–400)
RBC: 3.97 MIL/uL (ref 3.70–5.45)
RDW: 13 % (ref 11.2–14.5)
WBC: 6.1 10*3/uL (ref 3.9–10.3)

## 2017-06-24 NOTE — Telephone Encounter (Signed)
  Patient declined avs and calender du to [employer] Per 4/22 los

## 2017-08-11 ENCOUNTER — Other Ambulatory Visit: Payer: Self-pay | Admitting: Hematology

## 2017-10-21 ENCOUNTER — Inpatient Hospital Stay: Payer: 59

## 2017-10-21 ENCOUNTER — Telehealth: Payer: Self-pay | Admitting: Hematology

## 2017-10-21 ENCOUNTER — Inpatient Hospital Stay: Payer: 59 | Admitting: Hematology

## 2017-10-21 NOTE — Telephone Encounter (Signed)
Returned call to patient re message she received re appointment and asking for confirmation. Per patient moved lab/fu from today to 8/29 @ 12:45 pm. Patient has new date/time.

## 2017-10-30 NOTE — Progress Notes (Signed)
[place]  [phone] [phone]  Clinic Follow Up Note   Patient Care Team: Olga Millers, MD as PCP - General (Obstetrics and Gynecology) 10/31/2017  CHIEF COMPLAINTS:  Malignant neoplasm of lower-inner quadrant of left breast in female, estrogen receptor positive  Oncology History   Cancer Staging Carcinoma of upper-outer quadrant of right breast in female, estrogen receptor positive ([place]) Staging form: Breast, AJCC 8th Edition - Pathologic stage from 01/18/2017: Stage IA (pT1a, pN0, cM0, G1, ER+, PR+, HER2-) - Signed by Truitt Merle, MD on 06/24/2017 - Clinical: Stage IA (cT1a, cN0(sn), cM0, G1, ER: Positive, PR: Positive, HER2: Negative) - Unsigned  Malignant neoplasm of lower-inner quadrant of left breast in female, estrogen receptor positive ([place]) Staging form: Breast, AJCC 8th Edition - Pathologic stage from 01/18/2017: Stage IA (pT1c, pN0(sn), cM0, G1, ER+, PR+, HER2-, Oncotype DX score: 14) - Signed by Truitt Merle, MD on 06/24/2017       Malignant neoplasm of lower-inner quadrant of left breast in female, estrogen receptor positive ([place])   12/14/2016 Mammogram    Diagnostic mammogram 12/14/16 IMPRESSION: Suspicious 9 x 7 x 9 mm irregular lobular hypoechoic left breast mass at the 630 o'clock 3 cm from the nipple, felt to correspond with mammographic focal architectural distortion. RECOMMENDATION: Ultrasound-guided core needle biopsy left breast mass.    12/18/2016 Initial Biopsy    Diagnosis 12/18/16 Breast, left, needle core biopsy, 6:30 o'clock - INVASIVE DUCTAL CARCINOMA, GRADE I.     12/18/2016 Receptors her2    Estrogen Receptor: 90%, POSITIVE, STRONG STAINING INTENSITY Progesterone Receptor: 90%, POSITIVE, STRONG STAINING INTENSITY Proliferation Marker Ki67: 5% HER2 NEGATIVE     12/20/2016 Initial Diagnosis    Malignant neoplasm of lower-inner quadrant of left breast in female, estrogen receptor positive ([place])    01/07/2017 Genetic Testing    Patient had genetic testing due to a personal history of breeast cancer and a family history of prostate and colon cancer. The Common Hereditary Cancers Panel + Prostate Cancer Panelwas ordered. The Hereditary Gene Panel offered by [facility] includes sequencing and/or deletion duplication testing of the following 48 genes: APC, ATM, AXIN2, BARD1, BMPR1A, BRCA1, BRCA2, BRIP1, CDH1, CDKN2A (p14ARF), CDKN2A (p16INK4a), CKD4, CHEK2, CTNNA1, DICER1, EPCAM (Deletion/duplication testing only), FANCA, GREM1 (promoter region deletion/duplication testing only), KIT, MEN1, MLH1, MSH2, MSH3, MSH6, MUTYH, NBN, NF1, NHTL1, PALB2, PDGFRA, PMS2, POLD1, POLE, PTEN, RAD50, RAD51C, RAD51D, SDHB, SDHC, SDHD, SMAD4, SMARCA4. STK11, TP53, TSC1, TSC2, and VHL.  The following genes were evaluated for sequence changes only: SDHA and HOXB13 c.251G>A variant only.  Results- Negative, no pathogenic variants identified.  The date of this test report is 01/07/2017    01/18/2017 Surgery    Bilateral Breast lumpectomy with Dr. Donne Hazel     01/18/2017 Pathology Results     Diagnosis 01/18/17 1. Breast, lumpectomy, Left - INVASIVE DUCTAL CARCINOMA, GRADE I/III, SPANNING 1.2 CM. - DUCTAL CARCINOMA IN SITU, LOW GRADE. - THE SURGICAL RESECTION MARGINS ARE NEGATIVE FOR CARCINOMA. - SEE ONCOLOGY TABLE BELOW. 2. Breast, excision, Left additional deep margin - FIBROCYSTIC CHANGES WITH ADENOSIS. - USUAL DUCTAL HYPERPLASIA. - THERE IS NO EVIDENCE OF MALIGNANCY. - SEE COMMENT. 3. Breast, lumpectomy, Right - LOBULAR NEOPLASIA (LOBULAR CARCINOMA IN SITU). - SEE COMMENT. 4. Breast, excision, Right additional Posterior Lateral - INVASIVE LOBULAR CARCINOMA, GRADE I/III, SPANNING, SPANNING 0.4 CM. - THE SURGICAL RESECTION MARGINS ARE NEGATIVE FOR CARCINOMA. - SEE ONCOLOGY TABLE BELOW. 5. Lymph node, sentinel, biopsy, Right Axillary - THERE IS NO EVIDENCE OF CARCINOMA  IN 1 OF 1 LYMPH NODE (0/1). - SEE  COMMENT. 6. Lymph node, sentinel, biopsy, Left Axillary - THERE IS NO EVIDENCE OF CARCINOMA IN 1 OF 1 LYMPH NODE (0/1). - SEE COMMENT. 7. Breast, excision, Left additional Inferior Margin - FIBROCYSTIC CHANGES. - THERE IS NO EVIDENCE OF MALIGNANCY. - SEE COMMENT.     01/18/2017 Oncotype testing    Her oncotype recurrence score is 14 and her distant recurrence on Tamoxifen is 10%.    03/12/2017 - 04/08/2017 Radiation Therapy    Radiation therapy with Dr. Lisbeth Renshaw    04/21/2017 -  Anti-estrogen oral therapy    20 mg Tamoxifen daily      Carcinoma of upper-outer quadrant of right breast in female, estrogen receptor positive ([place])   01/18/2017 Cancer Staging    Staging form: Breast, AJCC 8th Edition - Pathologic stage from 01/18/2017: Stage IA (pT1a, pN0, cM0, G1, ER+, PR+, HER2-) - Signed by Truitt Merle, MD on 06/24/2017    02/12/2017 Initial Diagnosis    Carcinoma of upper-outer quadrant of right breast in female, estrogen receptor positive ([place])     HISTORY OF PRESENTING ILLNESS: 12/26/16 Pamela Whitney 52 y.o. female is here because of newly diagnosed left breast cancer. She presents to breast clinic today with her husband.   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 She had rhinoplasty at 52 yo and 2 C-sections. Her mother and maternal grandmother had colon cancer. She smoked on and off for about 15 years, half a pack a week.   Today she notes her lump was found by screening mammogram. She denies any breast change, pain or fatigue. This year her periods have becomes less regular. She works in Program[employer], applications. She notes for a few months she started having hot flashes and night sweats, but homone test was done and she is likely pre or perimenopausal. In the past 6 months she has felt a small lump in the left side of her lump. She notices her fingers are swelling but no pain.    GYN HISTORY  Menarchal: 16 LMP: 12/12/16 Contraceptive: for 20 years,  stopped 10 years ago.  HRT: NA GP: G3P2A1, miscarriage, first birth at 74. Has 2 girls  CURRENT THERAPY: Tamoxifen 20 mg daily started on 04/21/17  INTERM HISTORY:  Pamela Whitney is here for follow up. She is here alone at the clinic. She experiences spontaneous epistaxis that startedTuesday last week and happens multiple times a day. The blood is bright red and sometimes clotted. The bleeding stops after 5 minutes of applying pressure.  She is tolerating tamoxifen well. She is intolerant to heat and her hands and feet sweat and become hot quickly. She is also not sleeping well, and uses melatonin to help.   Her menses are irregular now and has been since starting Tamoxifen.    MEDICAL HISTORY:  Past Medical History:  Diagnosis Date  . Allergy    PCNS  . Breast cancer ([place]) 01/2017   Bilateral  . Family history of colon cancer   . Family history of prostate cancer in father     SURGICAL HISTORY: Past Surgical History:  Procedure Laterality Date  . CESAREAN SECTION    . RADIOACTIVE SEED GUIDED PARTIAL MASTECTOMY WITH AXILLARY SENTINEL LYMPH NODE BIOPSY Bilateral 01/18/2017   Procedure: BILATERAL BREAST LUMPECTOMY WITH  BILATERAL RADIOACTIVE SEED AND BILATERAL SENTINEL LYMPH NODE BIOPSY;  Surgeon: Rolm Bookbinder, MD;  Location: [facility];  Service: General;  Laterality: Bilateral;  . RHINOPLASTY    . WISDOM TOOTH EXTRACTION      SOCIAL HISTORY: Social History   Socioeconomic History  . Marital status: Married    Spouse name: Not on file  . Number of children: Not on file  . Years of education: Not on file  . Highest education level: Not on file  Occupational History  . Not on file  Social Needs  . Financial resource strain: Not on file  . Food insecurity:    Worry: Not on file    Inability: Not on file  . Transportation needs:    Medical: Not on file    Non-medical: Not on file  Tobacco Use  . Smoking status: Former Smoker    Packs/day: 0.10     Years: 15.00    Pack years: 1.50    Types: Cigarettes    Last attempt to quit: 12/26/2001    Years since quitting: 15.8  . Smokeless tobacco: Never Used  Substance and Sexual Activity  . Alcohol use: Yes    Alcohol/week: 1.0 standard drinks    Types: 1 Cans of beer per week    Comment: 3-5 times a week   . Drug use: No  . Sexual activity: Yes    Birth control/protection: Surgical    Comment: husband has had vasectomy  Lifestyle  . Physical activity:    Days per week: Not on file    Minutes per session: Not on file  . Stress: Not on file  Relationships  . Social connections:    Talks on phone: Not on file    Gets together: Not on file    Attends religious service: Not on file    Active member of club or organization: Not on file    Attends meetings of clubs or organizations: Not on file    Relationship status: Not on file  . Intimate partner violence:    Fear of current or ex partner: Not on file    Emotionally abused: Not on file    Physically abused: Not on file    Forced sexual activity: Not on file  Other Topics Concern  . Not on file  Social History Narrative  . Not on file    FAMILY HISTORY: Family History  Problem Relation Age of Onset  . Colon cancer Mother 62  . Prostate cancer Father 67       prostectomy, no radiation  . Colon cancer Maternal Grandmother 50  . Stroke Paternal Grandfather   . HIV Paternal Uncle   . Breast cancer Neg Hx     ALLERGIES:  is allergic to penicillins.  MEDICATIONS:  Current Outpatient Medications  Medication Sig Dispense Refill  . ibuprofen (ADVIL,MOTRIN) 200 MG tablet Take 200 mg every 6 (six) hours as needed by mouth.    . tamoxifen (NOLVADEX) 20 MG tablet TAKE 1 TABLET(20 MG) BY MOUTH DAILY 30 tablet 2   No current facility-administered medications for this visit.     REVIEW OF SYSTEMS:   Constitutional: Denies fevers, chills (+) heat intolerance (+) insomnia  Eyes: Denies blurriness of vision, double vision or  watery eyes Ears, nose, mouth, throat, and face: Denies mucositis or sore throat (+) multiple episodes of epistaxis  Respiratory: Denies cough, dyspnea or wheezes Cardiovascular: Denies palpitation, chest discomfort or lower extremity swelling  Gastrointestinal:  Denies nausea, heartburn or change in bowel habits Skin: Denies abnormal skin rashes Lymphatics: Denies new lymphadenopathy or easy bruising Neurological:Denies  numbness, tingling or new weaknesses Behavioral/Psych: Mood is stable, no new changes  Breast: No new breast pain or complaints  All other systems were reviewed with the patient and are negative.  PHYSICAL EXAMINATION:  ECOG PERFORMANCE STATUS: 0 - Asymptomatic  Vitals:   10/31/17 1332  BP: 136/88  Pulse: 92  Resp: 18  Temp: 99.2 F (37.3 C)  SpO2: 100%   Filed Weights   10/31/17 1332  Weight: 139 lb 11.2 oz (63.4 kg)    GENERAL:alert, no distress and comfortable SKIN: skin color, texture, turgor are normal, no rashes or significant lesions EYES: normal, conjunctiva are pink and non-injected, sclera clear OROPHARYNX:no exudate, no erythema and lips, buccal mucosa, and tongue normal  NECK: supple, thyroid normal size, non-tender LYMPH:  no palpable lymphadenopathy in the cervical, axillary or inguinal LUNGS: clear to auscultation and percussion with normal breathing effort HEART: regular rate & rhythm and no murmurs and no lower extremity edema ABDOMEN:abdomen soft, non-tender and normal bowel sounds Musculoskeletal:no cyanosis of digits and no clubbing  PSYCH: alert & oriented x 3 with fluent speech NEURO: no focal motor/sensory deficits BILATERAL BREAST: bilateral breast status post lumpectomy, surgical scars near the axilla have healed well. No palpable breast mass or adenopathy. (+) breast is lumpy at incision site, but not concerning of recurrence.   LABORATORY DATA:  I have reviewed the data as listed CBC Latest Ref Rng & Units 10/31/2017 06/24/2017  12/26/2016  WBC 3.9 - 10.3 K/uL 5.4 6.1 5.1  Hemoglobin 11.6 - 15.9 g/dL 12.2 12.5 12.9  Hematocrit 34.8 - 46.6 % 35.6 36.9 36.9  Platelets 145 - 400 K/uL 231 244 267    CMP Latest Ref Rng & Units 10/31/2017 06/24/2017 12/26/2016  Glucose 70 - 99 mg/dL 108(H) 104 101  BUN 6 - 20 mg/dL 11 15 11.1  Creatinine 0.44 - 1.00 mg/dL 0.96 1.08 0.9  Sodium 135 - 145 mmol/L 142 140 138  Potassium 3.5 - 5.1 mmol/L 4.1 4.4 4.4  Chloride 98 - 111 mmol/L 106 106 -  CO2 22 - 32 mmol/L _0 Calcium 8.9 - 10.3 mg/dL 9.2 9.4 9.1  Total Protein 6.5 - 8.1 g/dL 7.1 7.5 7.4  Total Bilirubin 0.3 - 1.2 mg/dL 0.3 0.2 0.50  Alkaline Phos 38 - 126 U/L 58 58 56  AST 15 - 41 U/L _1 ALT 0 - 44 U/L 35 15 18    PATHOLOGY  Diagnosis 12/18/16 Breast, left, needle core biopsy, 6:30 o'clock - INVASIVE DUCTAL CARCINOMA, GRADE I. Microscopic Comment Breast prognostic profile will be performed. Immunohistochemistry for calponin, smooth muscle myosin and p63 show a lack of basal cell staining supporting the diagnosis. 1 of 2 FINAL for Pamela Whitney, Pamela Whitney ([id number]) Microscopic Comment(continued) Dr. Melina Copa agrees. Called to [facility] on 12/19/16 and 12/20/16. PROGNOSTIC INDICATORS Results: IMMUNOHISTOCHEMICAL AND MORPHOMETRIC ANALYSIS PERFORMED MANUALLY Estrogen Receptor: 90%, POSITIVE, STRONG STAINING INTENSITY Progesterone Receptor: 90%, POSITIVE, STRONG STAINING INTENSITY Proliferation Marker Ki67: 5% REFERENCE RANGE ESTROGEN RECEPTOR NEGATIVE 0% POSITIVE =>1% REFERENCE RANGE PROGESTERONE RECEPTOR NEGATIVE 0% POSITIVE =>1% All controls stained appropriately  Diagnosis 01/08/17 Breast, right, needle core biopsy, 9:30 o'clock - INVASIVE MAMMARY CARCINOMA - MAMMARY CARCINOMA IN-SITU - SEE COMMENT Microscopic Comment The biopsy material shows an infiltrative proliferation of cells with large vesicular nuclei with inconspicuous nucleoli, arranged linearly and in small  clusters. Based on the biopsy, the carcinoma appears Nottingham grade 1 of 3 and measures 0.6 cm in greatest linear extent. E-cadherin  and prognostic markers (ER/PR/ki-67/HER2-FISH) Results: HER2 - NEGATIVE RATIO OF HER2/CEP17 SIGNALS 1.29 AVERAGE HER2 COPY NUMBER PER CELL 2.20 Results: IMMUNOHISTOCHEMICAL AND MORPHOMETRIC ANALYSIS PERFORMED MANUALLY Estrogen Receptor: 40%, POSITIVE, STRONG STAINING INTENSITY Progesterone Receptor: 50%, POSITIVE, STRONG STAINING INTENSITY Proliferation Marker Ki67: 5%  Diagnosis 01/18/17 1. Breast, lumpectomy, Left - INVASIVE DUCTAL CARCINOMA, GRADE I/III, SPANNING 1.2 CM. - DUCTAL CARCINOMA IN SITU, LOW GRADE. - THE SURGICAL RESECTION MARGINS ARE NEGATIVE FOR CARCINOMA. - SEE ONCOLOGY TABLE BELOW. 2. Breast, excision, Left additional deep margin - FIBROCYSTIC CHANGES WITH ADENOSIS. - USUAL DUCTAL HYPERPLASIA. - THERE IS NO EVIDENCE OF MALIGNANCY. - SEE COMMENT. 3. Breast, lumpectomy, Right - LOBULAR NEOPLASIA (LOBULAR CARCINOMA IN SITU). - SEE COMMENT. 4. Breast, excision, Right additional Posterior Lateral - INVASIVE LOBULAR CARCINOMA, GRADE I/III, SPANNING, SPANNING 0.4 CM. - THE SURGICAL RESECTION MARGINS ARE NEGATIVE FOR CARCINOMA. - SEE ONCOLOGY TABLE BELOW. 5. Lymph node, sentinel, biopsy, Right Axillary - THERE IS NO EVIDENCE OF CARCINOMA IN 1 OF 1 LYMPH NODE (0/1). - SEE COMMENT. 6. Lymph node, sentinel, biopsy, Left Axillary - THERE IS NO EVIDENCE OF CARCINOMA IN 1 OF 1 LYMPH NODE (0/1). - SEE COMMENT. 7. Breast, excision, Left additional Inferior Margin - FIBROCYSTIC CHANGES. - THERE IS NO EVIDENCE OF MALIGNANCY. - SEE COMMENT. Microscopic Comment 1. BREAST, INVASIVE TUMOR (LEFT BREAST) Procedure: Seed localized lumpectomy Laterality: Left Tumor Size: 1.2 cm Histologic Type: Ductal Grade: I Tubular Differentiation: 2 Nuclear Pleomorphism: 1 Mitotic Count: 1 Ductal Carcinoma in Situ (DCIS): Present, low grade Extent of  Tumor: Confined to breast parenchyma Margins: Greater than 0.2 cm to all margins Regional Lymph Nodes: Number of Lymph Nodes Examined: 1 Number of Sentinel Lymph Nodes Examined: 1 Lymph Nodes with Macrometastases: 0 Lymph Nodes with Micrometastases: 0 Lymph Nodes with Isolated Tumor Cells: 0 Breast Prognostic Profile: [phone] Estrogen Receptor: 90%, strong Progesterone Receptor: 90%, strong Her2: No amplification was detected. The ratio was 1.38 Ki-67: 5% Best tumor block for sendout testing: 1A Pathologic Stage Classification (pTNM, AJCC 8th Edition): Primary Tumor (pT): pT1c Regional Lymph Nodes (pN): pN0 Distant Metastases (pM): pMX 2. , 4, 7. The surgical resection margin(s) of the specimen were inked and microscopically evaluated. 3. Invasive carcinoma is not identified in specimen 3. The surgical resection margin(s) of the specimen were inked and microscopically evaluated. 4. BREAST, INVASIVE TUMOR (RIGHT BREAST) Procedure: Seed localized lumpectomy and additional posterior/lateral margin of resection Laterality: Right Tumor Size: 0.4 cm (glass slide measurement) Histologic Type: Lobular Grade: I Tubular Differentiation: 2 Nuclear Pleomorphism: 1 Mitotic Count: 1 Ductal Carcinoma in Situ (DCIS): Not identified Extent of Tumor: Confined to breast parenchyma Margins: Greater than 0.2 cm to all margins Regional Lymph Nodes: Number of Lymph Nodes Examined: 1 Number of Sentinel Lymph Nodes Examined: 1 Lymph Nodes with Macrometastases: 0 Lymph Nodes with Micrometastases: 0 Lymph Nodes with Isolated Tumor Cells: 0 Breast Prognostic Profile: Case [id number] Estrogen Receptor: 40%, strong Progesterone Receptor: 50%, strong Her2: No amplification was detected. The ratio was 1.29 Ki-67: 5% Best tumor block for sendout testing: 4B, although limited Pathologic Stage Classification (pTNM, AJCC 8th Edition): Primary Tumor (pT): pT1a Regional Lymph Nodes (pN):  pN0 Distant Metastases (pM): pMX Comment: The main right lumpectomy specimen shows lobular carcinoma in situ. There is a 0.4 cm focus of invasive lobular carcinoma present in specimen number 4. 5. , 6. Immunohistochemical stains performed on parts 5 and 6 fail to highlight the presence of cytokeratin positive tumor cells. (JBK:ecj 01/23/2017)    RADIOGRAPHIC STUDIES: I  have personally reviewed the radiological images as listed and agreed with the findings in the report.  12/31/2016 MRI Breast IMPRESSION: Marked background parenchymal enhancement decreases greatly the sensitivity of this study.  The solitary left breast 630 o'clock biopsy proven malignancy measures 1.2 cm in greatest dimension.  Indeterminate progressively enhancing right breast 930 o'clock mass which measures 8 mm.  Diagnostic Mammogram 12/14/16 IMPRESSION: Suspicious irregular hypoechoic left breast mass, felt to correspond with mammographic focal architectural distortion.  Screening Mammogram 12/03/16 IMPRESSION: Suspicious irregular hypoechoic left breast mass, felt to correspond with mammographic focal architectural distortion.  ASSESSMENT & PLAN:  Pamela Whitney is a 52 y.o. caucasian female with no significant medical history.    1. 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 Donne Hazel on 01/18/17 with pathology results revealing a 1.2 cm invasive ductal carcinoma in the left breast and 0.4 cm invasive lobular carcinoma in the right breast.  One lymph nodes on each side were removed which were negative and there were clear margins of breast tissue.  -Her oncotype recurrence score is 14 and her 10-year distant  recurrence on Tamoxifen is 10%. I previously discussed these results with her and how she would not benefit much from chemo.  -previously offered survivorship clinic and she declined  -She started Tamoxifen on 04/21/17 and has been tolerating moderately well. She is clinically doing well. Labs reviewed, CBC and CMP are WNL.  -Clinical exam was negative, no clinical concern for recurrence. -Her menses are irregular.  We discussed that tamoxifen may impact her menstrual period.  She could have been perimenopausal, we discussed switching tamoxifen to aromatase inhibitor after she becomes postmenopausal. - She will be due for a mammogram in October 2019. She has dense breast (category C), we discussed the role of screening breast MRI and she would like to have one, will schedule for 02/2018 since she has met deductible this year. -F/u in 4 months   2. Genetic Testing -although she has no family history of breast/ovarian cancer, due to her young age she is interested in genetic testing, will refer her  -Her genetic testing results were negative with no pathogenic mutations (01/18/17)  3. Insomnia from restlessness -She reports she starting having trouble sleeping once she started taking Tamoxifen, partly due to hot flash but more due to restlessness. -I advised her to try exercising and she can try Melatonin   4. Epistaxis -She's been having multiple episodes of epistaxis for a week now, and have been happening multiple times a day.  -The bleeding is unilateral and stops 5 minutes after applying pressure.  -I do not think that her epistaxis is related to Tamoxifen. I will refer to ENT.  PLAN:   -Continue Tamoxifen  -Mammogram in Oct 2019 and Breast MRI in 02/2018 -Lab and f/u In 4 months -I encouraged her to call her ENT Dr. Redmond Baseman for her recurrent epistaxis -She declined survivorship visit   No orders of the defined types were placed in this encounter.  All questions were answered. The  patient knows to call the clinic with any problems, questions or concerns. I spent 20 minutes counseling the patient face to face. The total time spent in the appointment was 25 minutes and more than 50% was on counseling.  Dierdre Searles Dweik am acting as scribe for Dr. Truitt Merle.  I have reviewed the above documentation for accuracy and completeness, and I agree with the above.      Truitt Merle, MD 10/31/2017

## 2017-10-31 ENCOUNTER — Inpatient Hospital Stay (HOSPITAL_BASED_OUTPATIENT_CLINIC_OR_DEPARTMENT_OTHER): Payer: 59 | Admitting: Hematology

## 2017-10-31 ENCOUNTER — Inpatient Hospital Stay: Payer: 59 | Attending: Hematology

## 2017-10-31 ENCOUNTER — Telehealth: Payer: Self-pay | Admitting: Hematology

## 2017-10-31 VITALS — BP 136/88 | HR 92 | Temp 99.2°F | Resp 18 | Ht 64.5 in | Wt 139.7 lb

## 2017-10-31 DIAGNOSIS — G4700 Insomnia, unspecified: Secondary | ICD-10-CM | POA: Insufficient documentation

## 2017-10-31 DIAGNOSIS — C50411 Malignant neoplasm of upper-outer quadrant of right female breast: Secondary | ICD-10-CM

## 2017-10-31 DIAGNOSIS — Z170 Estrogen receptor positive status [ER+]: Secondary | ICD-10-CM | POA: Diagnosis not present

## 2017-10-31 DIAGNOSIS — R040 Epistaxis: Secondary | ICD-10-CM

## 2017-10-31 DIAGNOSIS — C50312 Malignant neoplasm of lower-inner quadrant of left female breast: Secondary | ICD-10-CM | POA: Diagnosis not present

## 2017-10-31 DIAGNOSIS — Z79810 Long term (current) use of selective estrogen receptor modulators (SERMs): Secondary | ICD-10-CM

## 2017-10-31 DIAGNOSIS — Z1231 Encounter for screening mammogram for malignant neoplasm of breast: Secondary | ICD-10-CM

## 2017-10-31 LAB — CBC WITH DIFFERENTIAL (CANCER CENTER ONLY)
BASOS ABS: 0 10*3/uL (ref 0.0–0.1)
Basophils Relative: 0 %
Eosinophils Absolute: 0 10*3/uL (ref 0.0–0.5)
Eosinophils Relative: 1 %
HEMATOCRIT: 35.6 % (ref 34.8–46.6)
Hemoglobin: 12.2 g/dL (ref 11.6–15.9)
Lymphocytes Relative: 31 %
Lymphs Abs: 1.6 10*3/uL (ref 0.9–3.3)
MCH: 32.1 pg (ref 25.1–34.0)
MCHC: 34.3 g/dL (ref 31.5–36.0)
MCV: 93.7 fL (ref 79.5–101.0)
MONO ABS: 0.3 10*3/uL (ref 0.1–0.9)
Monocytes Relative: 5 %
NEUTROS ABS: 3.4 10*3/uL (ref 1.5–6.5)
NEUTROS PCT: 63 %
PLATELETS: 231 10*3/uL (ref 145–400)
RBC: 3.8 MIL/uL (ref 3.70–5.45)
RDW: 12.3 % (ref 11.2–14.5)
WBC Count: 5.4 10*3/uL (ref 3.9–10.3)

## 2017-10-31 LAB — CMP (CANCER CENTER ONLY)
ALK PHOS: 58 U/L (ref 38–126)
ALT: 35 U/L (ref 0–44)
ANION GAP: 9 (ref 5–15)
AST: 29 U/L (ref 15–41)
Albumin: 3.8 g/dL (ref 3.5–5.0)
BUN: 11 mg/dL (ref 6–20)
CALCIUM: 9.2 mg/dL (ref 8.9–10.3)
CO2: 27 mmol/L (ref 22–32)
Chloride: 106 mmol/L (ref 98–111)
Creatinine: 0.96 mg/dL (ref 0.44–1.00)
GLUCOSE: 108 mg/dL — AB (ref 70–99)
Potassium: 4.1 mmol/L (ref 3.5–5.1)
Sodium: 142 mmol/L (ref 135–145)
TOTAL PROTEIN: 7.1 g/dL (ref 6.5–8.1)
Total Bilirubin: 0.3 mg/dL (ref 0.3–1.2)

## 2017-10-31 MED ORDER — TAMOXIFEN CITRATE 20 MG PO TABS: 20.0000 mg | ORAL_TABLET | Freq: Every day | ORAL | 1 refills | Status: DC

## 2017-10-31 NOTE — Telephone Encounter (Signed)
Appts scheduled AVS/Calendar printed per 8/29 los °

## 2017-11-02 ENCOUNTER — Encounter: Payer: Self-pay | Admitting: Hematology

## 2017-11-14 ENCOUNTER — Other Ambulatory Visit: Payer: Self-pay | Admitting: Hematology

## 2017-11-14 DIAGNOSIS — Z9889 Other specified postprocedural states: Secondary | ICD-10-CM

## 2017-11-14 DIAGNOSIS — Z170 Estrogen receptor positive status [ER+]: Principal | ICD-10-CM

## 2017-11-14 DIAGNOSIS — C50312 Malignant neoplasm of lower-inner quadrant of left female breast: Secondary | ICD-10-CM

## 2017-12-05 ENCOUNTER — Ambulatory Visit
Admission: RE | Admit: 2017-12-05 | Discharge: 2017-12-05 | Disposition: A | Discharge status: Home / Self Care | Payer: 59 | Source: Ambulatory Visit | Attending: Hematology | Admitting: Hematology

## 2017-12-05 DIAGNOSIS — Z170 Estrogen receptor positive status [ER+]: Principal | ICD-10-CM

## 2017-12-05 DIAGNOSIS — Z9889 Other specified postprocedural states: Secondary | ICD-10-CM

## 2017-12-05 DIAGNOSIS — C50312 Malignant neoplasm of lower-inner quadrant of left female breast: Secondary | ICD-10-CM

## 2017-12-20 ENCOUNTER — Ambulatory Visit
Admission: RE | Admit: 2017-12-20 | Discharge: 2017-12-20 | Disposition: A | Discharge status: Home / Self Care | Payer: 59 | Source: Ambulatory Visit | Attending: Hematology | Admitting: Hematology

## 2017-12-20 DIAGNOSIS — Z1231 Encounter for screening mammogram for malignant neoplasm of breast: Secondary | ICD-10-CM

## 2017-12-20 MED ORDER — GADOBENATE DIMEGLUMINE 529 MG/ML IV SOLN
12.0000 mL | Freq: Once | INTRAVENOUS | Status: AC | PRN
  Administered 2017-12-20: 12 mL via INTRAVENOUS

## 2017-12-23 ENCOUNTER — Telehealth: Payer: Self-pay | Admitting: Hematology

## 2017-12-23 ENCOUNTER — Other Ambulatory Visit: Payer: Self-pay | Admitting: Hematology

## 2017-12-23 DIAGNOSIS — Z170 Estrogen receptor positive status [ER+]: Principal | ICD-10-CM

## 2017-12-23 DIAGNOSIS — C50312 Malignant neoplasm of lower-inner quadrant of left female breast: Secondary | ICD-10-CM

## 2017-12-23 NOTE — Telephone Encounter (Signed)
I have called patient and discussed her screening breast MRI findings, which showed a small circumscribed oval mass measuring 5 mm in the upper inner quadrant of left breast, and MRI guided breast biopsy was recommended by the radiologist.  She is agreeable with biopsy, orders placed, and I informed the breast center.  Pamela Whitney  12/23/2017

## 2017-12-24 ENCOUNTER — Other Ambulatory Visit: Payer: Self-pay | Admitting: Hematology

## 2017-12-24 DIAGNOSIS — C50312 Malignant neoplasm of lower-inner quadrant of left female breast: Secondary | ICD-10-CM

## 2017-12-24 DIAGNOSIS — Z170 Estrogen receptor positive status [ER+]: Principal | ICD-10-CM

## 2018-01-03 ENCOUNTER — Ambulatory Visit
Admission: RE | Admit: 2018-01-03 | Discharge: 2018-01-03 | Disposition: A | Discharge status: Home / Self Care | Payer: 59 | Source: Ambulatory Visit | Attending: Hematology | Admitting: Hematology

## 2018-01-03 DIAGNOSIS — Z170 Estrogen receptor positive status [ER+]: Principal | ICD-10-CM

## 2018-01-03 DIAGNOSIS — C50312 Malignant neoplasm of lower-inner quadrant of left female breast: Secondary | ICD-10-CM

## 2018-01-03 MED ORDER — GADOBUTROL 1 MMOL/ML IV SOLN
6.0000 mL | Freq: Once | INTRAVENOUS | Status: AC | PRN
  Administered 2018-01-03: 6 mL via INTRAVENOUS

## 2018-02-23 NOTE — Progress Notes (Signed)
[facility]   [phone] [phone]   Clinic Follow up Note   Patient Care Team: Olga Millers, MD as PCP - General (Obstetrics and Gynecology) Melida Quitter, MD as Consulting Physician (Otolaryngology) 02/24/2018  CHIEF COMPLAINT: Follow-up of bilateral stage I breast cancer  SUMMARY OF ONCOLOGIC HISTORY: Oncology History   Cancer Staging Carcinoma of upper-outer quadrant of right breast in female, estrogen receptor positive ([place]) Staging form: Breast, AJCC 8th Edition - Pathologic stage from 01/18/2017: Stage IA (pT1a, pN0, cM0, G1, ER+, PR+, HER2-) - Signed by Truitt Merle, MD on 06/24/2017 - Clinical: Stage IA (cT1a, cN0(sn), cM0, G1, ER: Positive, PR: Positive, HER2: Negative) - Unsigned  Malignant neoplasm of lower-inner quadrant of left breast in female, estrogen receptor positive ([place]) Staging form: Breast, AJCC 8th Edition - Pathologic stage from 01/18/2017: Stage IA (pT1c, pN0(sn), cM0, G1, ER+, PR+, HER2-, Oncotype DX score: 14) - Signed by Truitt Merle, MD on 06/24/2017       Malignant neoplasm of lower-inner quadrant of left breast in female, estrogen receptor positive ([place])   12/14/2016 Mammogram    Diagnostic mammogram 12/14/16 IMPRESSION: Suspicious 9 x 7 x 9 mm irregular lobular hypoechoic left breast mass at the 630 o'clock 3 cm from the nipple, felt to correspond with mammographic focal architectural distortion. RECOMMENDATION: Ultrasound-guided core needle biopsy left breast mass.    12/18/2016 Initial Biopsy    Diagnosis 12/18/16 Breast, left, needle core biopsy, 6:30 o'clock - INVASIVE DUCTAL CARCINOMA, GRADE I.     12/18/2016 Receptors her2    Estrogen Receptor: 90%, POSITIVE, STRONG STAINING INTENSITY Progesterone Receptor: 90%, POSITIVE, STRONG STAINING INTENSITY Proliferation Marker Ki67: 5% HER2 NEGATIVE     12/20/2016 Initial Diagnosis    Malignant neoplasm of lower-inner quadrant of left breast in female,  estrogen receptor positive ([place])    01/07/2017 Genetic Testing    Patient had genetic testing due to a personal history of breeast cancer and a family history of prostate and colon cancer. The Common Hereditary Cancers Panel + Prostate Cancer Panelwas ordered. The Hereditary Gene Panel offered by [facility] includes sequencing and/or deletion duplication testing of the following 48 genes: APC, ATM, AXIN2, BARD1, BMPR1A, BRCA1, BRCA2, BRIP1, CDH1, CDKN2A (p14ARF), CDKN2A (p16INK4a), CKD4, CHEK2, CTNNA1, DICER1, EPCAM (Deletion/duplication testing only), FANCA, GREM1 (promoter region deletion/duplication testing only), KIT, MEN1, MLH1, MSH2, MSH3, MSH6, MUTYH, NBN, NF1, NHTL1, PALB2, PDGFRA, PMS2, POLD1, POLE, PTEN, RAD50, RAD51C, RAD51D, SDHB, SDHC, SDHD, SMAD4, SMARCA4. STK11, TP53, TSC1, TSC2, and VHL.  The following genes were evaluated for sequence changes only: SDHA and HOXB13 c.251G>A variant only.  Results- Negative, no pathogenic variants identified.  The date of this test report is 01/07/2017    01/18/2017 Surgery    Bilateral Breast lumpectomy with Dr. Donne Hazel     01/18/2017 Pathology Results     Diagnosis 01/18/17 1. Breast, lumpectomy, Left - INVASIVE DUCTAL CARCINOMA, GRADE I/III, SPANNING 1.2 CM. - DUCTAL CARCINOMA IN SITU, LOW GRADE. - THE SURGICAL RESECTION MARGINS ARE NEGATIVE FOR CARCINOMA. - SEE ONCOLOGY TABLE BELOW. 2. Breast, excision, Left additional deep margin - FIBROCYSTIC CHANGES WITH ADENOSIS. - USUAL DUCTAL HYPERPLASIA. - THERE IS NO EVIDENCE OF MALIGNANCY. - SEE COMMENT. 3. Breast, lumpectomy, Right - LOBULAR NEOPLASIA (LOBULAR CARCINOMA IN SITU). - SEE COMMENT. 4. Breast, excision, Right additional Posterior Lateral - INVASIVE LOBULAR CARCINOMA, GRADE I/III, SPANNING, SPANNING 0.4 CM. - THE SURGICAL RESECTION MARGINS ARE NEGATIVE FOR CARCINOMA. - SEE ONCOLOGY TABLE BELOW. 5. Lymph node, sentinel, biopsy, Right Axillary -  THERE IS NO EVIDENCE OF CARCINOMA IN  1 OF 1 LYMPH NODE (0/1). - SEE COMMENT. 6. Lymph node, sentinel, biopsy, Left Axillary - THERE IS NO EVIDENCE OF CARCINOMA IN 1 OF 1 LYMPH NODE (0/1). - SEE COMMENT. 7. Breast, excision, Left additional Inferior Margin - FIBROCYSTIC CHANGES. - THERE IS NO EVIDENCE OF MALIGNANCY. - SEE COMMENT.     01/18/2017 Oncotype testing    Her oncotype recurrence score is 14 and her distant recurrence on Tamoxifen is 10%.    03/12/2017 - 04/08/2017 Radiation Therapy    Radiation therapy with Dr. Lisbeth Renshaw    04/21/2017 -  Anti-estrogen oral therapy    20 mg Tamoxifen daily     12/05/2017 Imaging    Diagnostic bilateral mammogram IMPRESSION: Interval bilateral posttreatment changes without mammographic evidence of malignancy.    12/20/2017 Imaging    Bilateral breast MRI  IMPRESSION: Expected postoperative changes bilaterally. 5 millimeter mass in the upper inner quadrant of the left breast warranting tissue diagnosis.     01/03/2018 Pathology Results    Breast, left, needle core biopsy, UIQ at posterior depth with fibrocystic changes. There is no evidence of malignancy.     Carcinoma of upper-outer quadrant of right breast in female, estrogen receptor positive ([place])   01/18/2017 Cancer Staging    Staging form: Breast, AJCC 8th Edition - Pathologic stage from 01/18/2017: Stage IA (pT1a, pN0, cM0, G1, ER+, PR+, HER2-) - Signed by Truitt Merle, MD on 06/24/2017    02/12/2017 Initial Diagnosis    Carcinoma of upper-outer quadrant of right breast in female, estrogen receptor positive ([place])     CURRENT THERAPY: Tamoxifen 20 mg daily started on 04/21/17  INTERVAL HISTORY: Pamela Whitney 52 y.o. presents to the office for follow up. She continues on tamoxifen with good tolerance. She continues to feel hot at night starting around 7 PM which is causes her to be unable to sleep. She sleeps 2 complete hours, then she dozes off and on. She sleeps a total of 4 hours nightly, and prior to tamoxifen, she only  slept 5 hours. She is a Education administrator during the day. She has tried melatonin once, however, she doesn't remember if it worked for her. She has had a cold recently. Her last menses was early July, 2019.   Since her last visit to the office, she underwent a left needle core biopsy on 01/03/2018 that showed: Breast, left, needle core biopsy, UIQ at posterior depth with fibrocystic changes. There is no evidence of malignancy.   She had a bilateral breast MRI on 12/20/2017 that showed:Expected postoperative changes bilaterally. 5 millimeter mass in the upper inner quadrant of the left breast warranting tissue diagnosis.   She also had a diagnostic bilateral mammogram on 12/05/2017 that showed: Interval bilateral posttreatment changes without mammographic evidence of malignancy.  On review of systems, she reports productive cough and right lateral breast pain. she denies fever, joint pain, and any other symptoms. Pertinent positives are listed and detailed within the above HPI.    REVIEW OF SYSTEMS:   Constitutional: Denies fevers, chills or abnormal weight loss Eyes: Denies blurriness of vision Ears, nose, mouth, throat, and face: Denies mucositis or sore throat Respiratory: Denies dyspnea or wheezes. (+) productive cough Chest: (+) right lateral breast pain Cardiovascular: Denies palpitation, chest discomfort or lower extremity swelling Gastrointestinal:  Denies nausea, heartburn or change in bowel habits Skin: Denies abnormal skin rashes Lymphatics: Denies new lymphadenopathy or easy bruising Neurological:Denies numbness, tingling or new weaknesses  Behavioral/Psych: Mood is stable, no new changes  All other systems were reviewed with the patient and are negative.  MEDICAL HISTORY:  Past Medical History:  Diagnosis Date  . Allergy    PCNS  . Breast cancer ([place]) 01/2017   Bilateral  . Family history of colon cancer   . Family history of prostate cancer in father     SURGICAL  HISTORY: Past Surgical History:  Procedure Laterality Date  . CESAREAN SECTION    . RADIOACTIVE SEED GUIDED PARTIAL MASTECTOMY WITH AXILLARY SENTINEL LYMPH NODE BIOPSY Bilateral 01/18/2017   Procedure: BILATERAL BREAST LUMPECTOMY WITH  BILATERAL RADIOACTIVE SEED AND BILATERAL SENTINEL LYMPH NODE BIOPSY;  Surgeon: Rolm Bookbinder, MD;  Location: [facility];  Service: General;  Laterality: Bilateral;  . RHINOPLASTY    . WISDOM TOOTH EXTRACTION      I have reviewed the social history and family history with the patient and they are unchanged from previous note.  ALLERGIES:  is allergic to penicillins.  MEDICATIONS:  Current Outpatient Medications  Medication Sig Dispense Refill  . ibuprofen (ADVIL,MOTRIN) 200 MG tablet Take 200 mg every 6 (six) hours as needed by mouth.    . tamoxifen (NOLVADEX) 20 MG tablet Take 1 tablet (20 mg total) by mouth daily. 90 tablet 1   No current facility-administered medications for this visit.     PHYSICAL EXAMINATION: ECOG PERFORMANCE STATUS: 0 - Asymptomatic  Vitals:   02/24/18 0802  BP: 123/77  Pulse: 78  Resp: 18  Temp: 98.1 F (36.7 C)  SpO2: 99%   Filed Weights   02/24/18 0802  Weight: 145 lb 6.4 oz (66 kg)    GENERAL:alert, no distress and comfortable SKIN: skin color, texture, turgor are normal, no rashes or significant lesions EYES: normal, Conjunctiva are pink and non-injected, sclera clear OROPHARYNX:no exudate, no erythema and lips, buccal mucosa, and tongue normal  NECK: supple, thyroid normal size, non-tender, without nodularity LYMPH:  no palpable lymphadenopathy in the cervical, axillary or inguinal LUNGS: clear to auscultation and percussion with normal breathing effort HEART: regular rate & rhythm and no murmurs and no lower extremity edema ABDOMEN:abdomen soft, non-tender and normal bowel sounds Musculoskeletal:no cyanosis of digits and no clubbing  NEURO: alert & oriented x 3 with fluent speech, no  focal motor/sensory deficits Breasts: bilateral lump[ectomy and sentinel lymph node biopsy sites have healed well without significant scar tissue.  No palpable breast mass or adenopathy.  LABORATORY DATA:  I have reviewed the data as listed CBC Latest Ref Rng & Units 02/24/2018 10/31/2017 06/24/2017  WBC 4.0 - 10.5 K/uL 4.0 5.4 6.1  Hemoglobin 12.0 - 15.0 g/dL 12.5 12.2 12.5  Hematocrit 36.0 - 46.0 % 36.9 35.6 36.9  Platelets 150 - 400 K/uL 179 231 244     CMP Latest Ref Rng & Units 10/31/2017 06/24/2017 12/26/2016  Glucose 70 - 99 mg/dL 108(H) 104 101  BUN 6 - 20 mg/dL 11 15 11.1  Creatinine 0.44 - 1.00 mg/dL 0.96 1.08 0.9  Sodium 135 - 145 mmol/L 142 140 138  Potassium 3.5 - 5.1 mmol/L 4.1 4.4 4.4  Chloride 98 - 111 mmol/L 106 106 -  CO2 22 - 32 mmol/L '27 25 25  '$ Calcium 8.9 - 10.3 mg/dL 9.2 9.4 9.1  Total Protein 6.5 - 8.1 g/dL 7.1 7.5 7.4  Total Bilirubin 0.3 - 1.2 mg/dL 0.3 0.2 0.50  Alkaline Phos 38 - 126 U/L 58 58 56  AST 15 - 41 U/L '29 17 20  '$ ALT  0 - 44 U/L 35 15 18      RADIOGRAPHIC STUDIES: I have personally reviewed the radiological images as listed and agreed with the findings in the report. No results found.   ASSESSMENT & PLAN:  No problem-specific Assessment & Plan notes found for this encounter.  1. 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 Donne Hazel on 01/18/17 with pathology results revealing a 1.2 cm invasive ductal carcinoma in the left breast and 0.4 cm invasive lobular carcinoma in the right breast.  One lymph nodes on each side were removed which were negative and there were clear margins of breast tissue.  -Her oncotype recurrence score is 14 and her 10-year distant  recurrence on Tamoxifen is 10%.  Due to low risk disease, adjuvant chemotherapy was not recommended. -She started Tamoxifen on 04/21/17 and has been tolerating moderately well.  -Diagnostic bilateral mammogram on 12/05/2017 showed: Interval bilateral posttreatment changes without mammographic evidence of malignancy. -MRI Breast on 12/20/2017 showed: Expected postoperative changes bilaterally. 5 millimeter mass in the upper inner quadrant of the left breast warranting tissue diagnosis.  -Left needle core biopsy on 01/03/2018 showed: Fibrocystic changes. There is no evidence of malignancy.  -Today, 02/24/2018 she is clinically doing well. Labs reviewed, CBC is WNL. CMP is PENDING -Clinical exam was negative, no clinical concern for recurrence.  -Patient last menses was July 2019 and I discussed that if she goes more than 1 year without a menses, then we will switch to another aromatase inhibitor  -Will refill 6 months of Tamoxifen today -Next MRI Breast will be Spring 2021.  Next mammogram in October 2020 -Lab and f/u in 6 months    2. Genetic Testing -although she has no family history of breast/ovarian cancer, due to her young age she is interested in genetic testing, patient previously referred.  -Her genetic testing results were negative with no pathogenic mutations (01/18/17)  3. 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 -Discussed with the patient regarding use of gabapentin to aid with hot flashes and insomnia at night. Patient would like to hold off on gabapentin prescription today.  -Discussed with the patient regarding the use of benadryl to aid with sleep as well.  -Patient has melatonin at home, and the patient was advised to try taking her melatonin at night to aid with insomnia and the patient notes that they will call the office if they would like  prescription medications.   4. URI, likely virus -Patient with productive cough, without a fever at this time.  -She denies sick contacts -This is likely viral, however, if the patient develops a fever or her symptoms worsen, then I advised the patient to call into the office and I will prescribe antibiotics.    PLAN:   -Continue Tamoxifen  Lab and f/u in 6 months    No orders of the defined types were placed in this encounter.  All questions were answered. The patient knows to call the clinic with any problems, questions or concerns. No barriers to learning was detected.  I spent 20 minutes counseling the patient face to face. The total time spent in the  appointment was 25 minutes and more than 50% was on counseling and review of test results     Truitt Merle, MD 02/24/2018  I, Soijett Blue am acting as scribe for Dr. Truitt Merle.  I have reviewed the above documentation for accuracy and completeness, and I agree with the above.

## 2018-02-24 ENCOUNTER — Inpatient Hospital Stay: Payer: 59 | Attending: Hematology

## 2018-02-24 ENCOUNTER — Encounter: Payer: Self-pay | Admitting: Hematology

## 2018-02-24 ENCOUNTER — Inpatient Hospital Stay (HOSPITAL_BASED_OUTPATIENT_CLINIC_OR_DEPARTMENT_OTHER): Payer: 59 | Admitting: Hematology

## 2018-02-24 ENCOUNTER — Telehealth: Payer: Self-pay | Admitting: Hematology

## 2018-02-24 VITALS — BP 123/77 | HR 78 | Temp 98.1°F | Resp 18 | Ht 64.5 in | Wt 145.4 lb

## 2018-02-24 DIAGNOSIS — Z170 Estrogen receptor positive status [ER+]: Secondary | ICD-10-CM | POA: Diagnosis not present

## 2018-02-24 DIAGNOSIS — C50411 Malignant neoplasm of upper-outer quadrant of right female breast: Secondary | ICD-10-CM | POA: Diagnosis not present

## 2018-02-24 DIAGNOSIS — J069 Acute upper respiratory infection, unspecified: Secondary | ICD-10-CM | POA: Insufficient documentation

## 2018-02-24 DIAGNOSIS — G4700 Insomnia, unspecified: Secondary | ICD-10-CM | POA: Diagnosis not present

## 2018-02-24 DIAGNOSIS — C50312 Malignant neoplasm of lower-inner quadrant of left female breast: Secondary | ICD-10-CM | POA: Insufficient documentation

## 2018-02-24 DIAGNOSIS — Z79810 Long term (current) use of selective estrogen receptor modulators (SERMs): Secondary | ICD-10-CM | POA: Diagnosis not present

## 2018-02-24 DIAGNOSIS — Z800 Family history of malignant neoplasm of digestive organs: Secondary | ICD-10-CM | POA: Insufficient documentation

## 2018-02-24 DIAGNOSIS — Z923 Personal history of irradiation: Secondary | ICD-10-CM | POA: Diagnosis not present

## 2018-02-24 LAB — CBC WITH DIFFERENTIAL (CANCER CENTER ONLY)
Abs Immature Granulocytes: 0.01 10*3/uL (ref 0.00–0.07)
BASOS ABS: 0 10*3/uL (ref 0.0–0.1)
BASOS PCT: 1 %
EOS ABS: 0.2 10*3/uL (ref 0.0–0.5)
Eosinophils Relative: 6 %
HCT: 36.9 % (ref 36.0–46.0)
Hemoglobin: 12.5 g/dL (ref 12.0–15.0)
Immature Granulocytes: 0 %
Lymphocytes Relative: 33 %
Lymphs Abs: 1.3 10*3/uL (ref 0.7–4.0)
MCH: 32.3 pg (ref 26.0–34.0)
MCHC: 33.9 g/dL (ref 30.0–36.0)
MCV: 95.3 fL (ref 80.0–100.0)
Monocytes Absolute: 0.5 10*3/uL (ref 0.1–1.0)
Monocytes Relative: 11 %
NEUTROS PCT: 49 %
NRBC: 0 % (ref 0.0–0.2)
Neutro Abs: 1.9 10*3/uL (ref 1.7–7.7)
PLATELETS: 179 10*3/uL (ref 150–400)
RBC: 3.87 MIL/uL (ref 3.87–5.11)
RDW: 11.8 % (ref 11.5–15.5)
WBC: 4 10*3/uL (ref 4.0–10.5)

## 2018-02-24 LAB — CMP (CANCER CENTER ONLY)
ALK PHOS: 57 U/L (ref 38–126)
ALT: 41 U/L (ref 0–44)
ANION GAP: 7 (ref 5–15)
AST: 29 U/L (ref 15–41)
Albumin: 3.6 g/dL (ref 3.5–5.0)
BUN: 10 mg/dL (ref 6–20)
CALCIUM: 9 mg/dL (ref 8.9–10.3)
CO2: 27 mmol/L (ref 22–32)
Chloride: 106 mmol/L (ref 98–111)
Creatinine: 0.93 mg/dL (ref 0.44–1.00)
GFR, Estimated: >60 mL/min (ref >60)
Glucose, Bld: 106 mg/dL — ABNORMAL HIGH (ref 70–99)
Potassium: 4.3 mmol/L (ref 3.5–5.1)
SODIUM: 140 mmol/L (ref 135–145)
TOTAL PROTEIN: 7.1 g/dL (ref 6.5–8.1)
Total Bilirubin: 0.5 mg/dL (ref 0.3–1.2)

## 2018-02-24 MED ORDER — TAMOXIFEN CITRATE 20 MG PO TABS: 20.0000 mg | ORAL_TABLET | Freq: Every day | ORAL | 1 refills | Status: DC

## 2018-02-24 NOTE — Telephone Encounter (Signed)
Patient declined avs and calendar  °

## 2018-03-26 ENCOUNTER — Other Ambulatory Visit: Payer: Self-pay | Admitting: Hematology

## 2018-03-26 ENCOUNTER — Encounter: Payer: Self-pay | Admitting: Hematology

## 2018-04-16 ENCOUNTER — Telehealth: Payer: Self-pay | Admitting: Hematology

## 2018-04-16 ENCOUNTER — Other Ambulatory Visit: Payer: Self-pay

## 2018-04-16 ENCOUNTER — Telehealth: Payer: Self-pay

## 2018-04-16 ENCOUNTER — Other Ambulatory Visit: Payer: Self-pay | Admitting: Hematology

## 2018-04-16 ENCOUNTER — Ambulatory Visit (HOSPITAL_COMMUNITY)
Admission: RE | Admit: 2018-04-16 | Discharge: 2018-04-16 | Disposition: A | Discharge status: Home / Self Care | Payer: 59 | Source: Ambulatory Visit | Attending: Hematology | Admitting: Hematology

## 2018-04-16 DIAGNOSIS — R05 Cough: Secondary | ICD-10-CM

## 2018-04-16 DIAGNOSIS — R059 Cough, unspecified: Secondary | ICD-10-CM

## 2018-04-16 MED ORDER — AZITHROMYCIN 500 MG PO TABS: 500.0000 mg | ORAL_TABLET | Freq: Every day | ORAL | 0 refills | Status: DC

## 2018-04-16 NOTE — Telephone Encounter (Signed)
Patient called this morning, reports fever 2 days ago, which has resolved, and cough for the past 2 days with mild sputum production. She complains of mild bilateral rib pain and wheezing when she coughs hard.  No muscle aches, or other new symptoms.  She has had sick contact from her work lately.  She did not come in for chest x-ray today, but not have time to see [place] in the clinic.  She had to go to work.  I spoke with her in the late afternoon, discussed her x-ray findings which was negative, and called in azithromycin 500 mg daily for 5 days, okay to use Robitussin over-the-counter for cough.  She appreciated the call.  Truitt Merle  04/16/2018

## 2018-04-16 NOTE — Telephone Encounter (Signed)
Patient calls with c/o of fever, congestion over the weekend, now having cough and wheezing, T99.3, was seen at [facility] this morning and they wouldn't prescribe because she is on Tamoxifen.  Spoke with Dr. Burr Medico, ordered chest x-ray at [facility], patient to go get chest x-ray but states she has to go to work.  [facility] not available this am.

## 2018-07-09 IMAGING — MR MR BREAST BX W LOC DEV 1ST LESION IMAGE BX SPEC MR GUIDE*R*
4 of 5 series · 35 of 48 positions shown · IV contrast (13ml Multihance)
Comparison: Previous exams.

ADDENDUM:
Pathology revealed GRADE I INVASIVE MAMMARY CARCINOMA, MAMMARY
CARCINOMA IN-SITU of the Right breast, 9:30 o'clock. This was found
to be concordant by Dr. Gaamangwe Baingapi. Pathology results were
discussed with the patient by telephone. The patient reported doing
well after the biopsy with tenderness at the site. Post biopsy
instructions and care were reviewed and questions were answered. The
patient was encouraged to call [REDACTED] for any additional concerns. The patient has a recent
diagnosis of left breast cancer and should follow her outlined
treatment plan. Dr. Aamir was notified of pathology results via
[REDACTED] message.

Pathology results reported by Hoselito Renckens, RN on 01/09/2017.
CLINICAL DATA: 50-year-old female presenting for MRI guided biopsy
of a right breast mass.
EXAM:
MRI GUIDED CORE NEEDLE BIOPSY OF THE RIGHT BREAST
TECHNIQUE: Multiplanar, multisequence MR imaging of the right breast was
performed both before and after administration of intravenous
contrast.
CONTRAST:  13 mL of MultiHance

[Series 2: fiducial unilateral · sagittal · 2.0mm · 1.33mm/px · 4 of 52 slices shown]
[im 1/52]
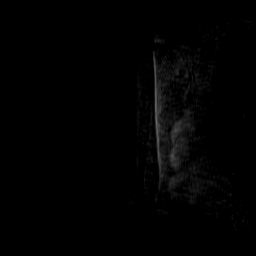
[im 18/52]
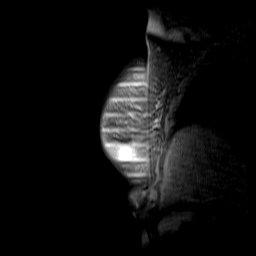
[im 35/52]
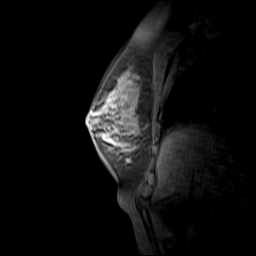
[im 52/52]
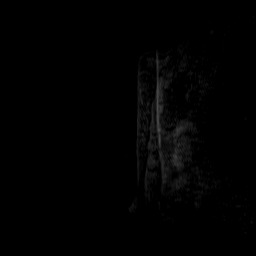

[Series 3: dynamic pre · axial · non-contrast · 1.3mm · 0.73mm/px · z∈[-82,+104]mm · 11 of 144 slices shown]
[im 1/144]
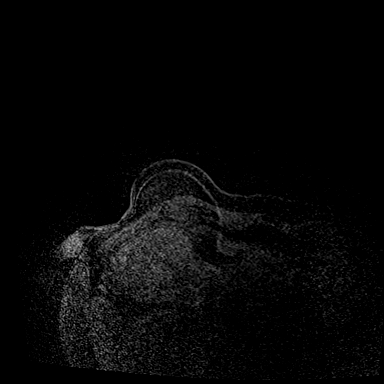
[im 15/144]
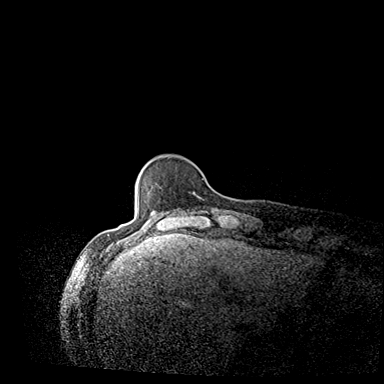
[im 29/144]
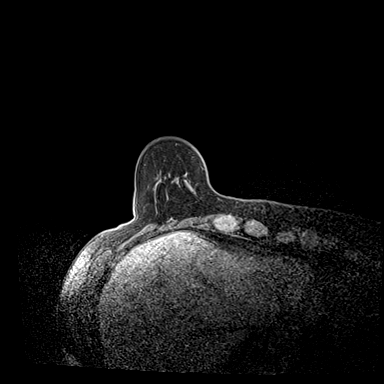
[im 43/144]
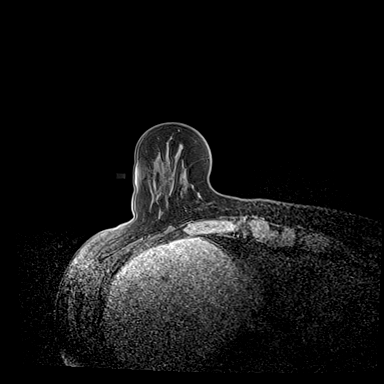
[im 58/144]
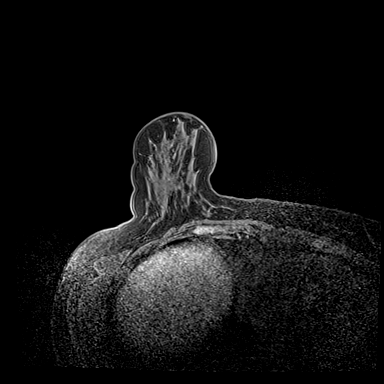
[im 72/144]
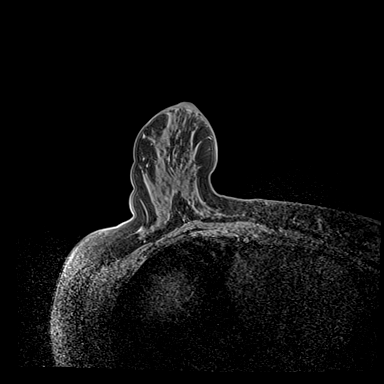
[im 86/144]
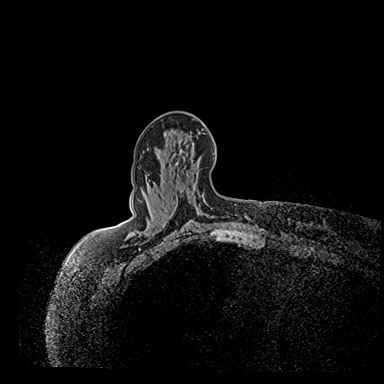
[im 101/144]
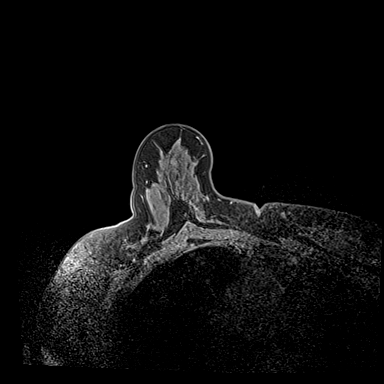
[im 115/144]
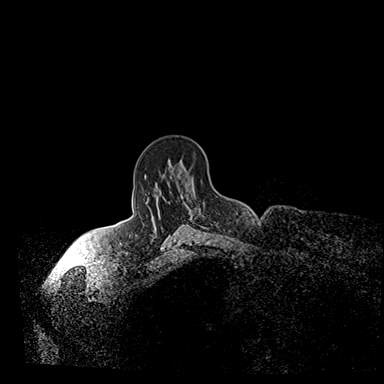
[im 129/144]
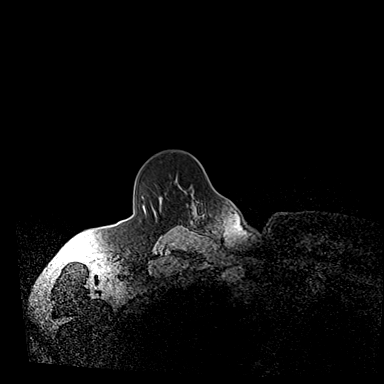
[im 144/144]
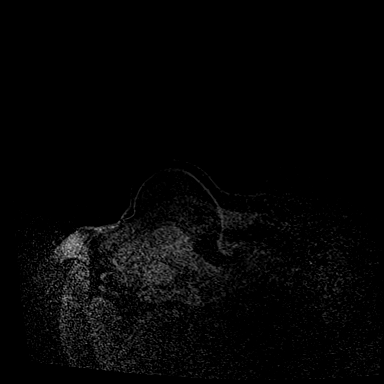

[Series 4: dynamic post 20 · axial · 1.3mm · 0.73mm/px · z∈[-82,+104]mm · 11 of 144 slices shown]
[im 1/144]
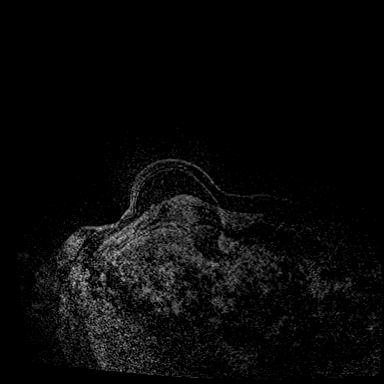
[im 15/144]
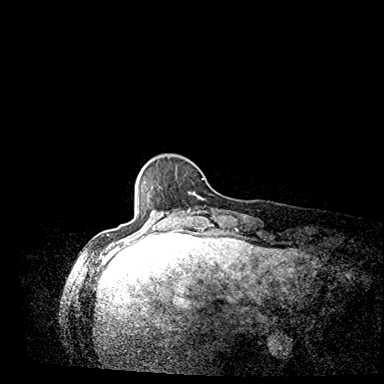
[im 29/144]
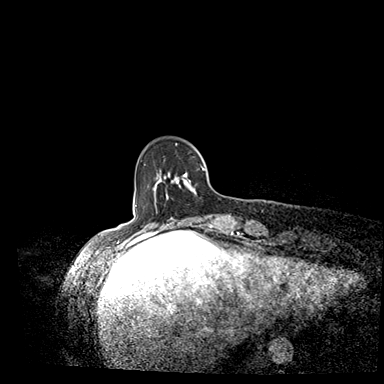
[im 43/144]
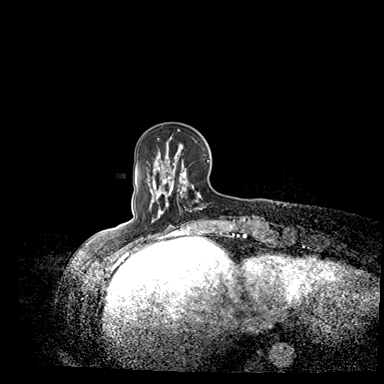
[im 58/144]
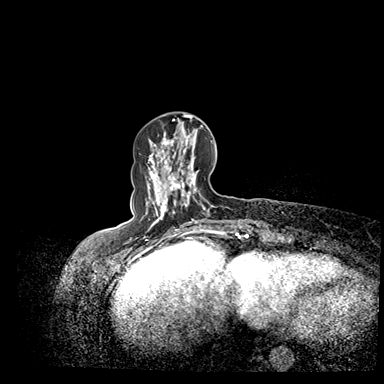
[im 72/144]
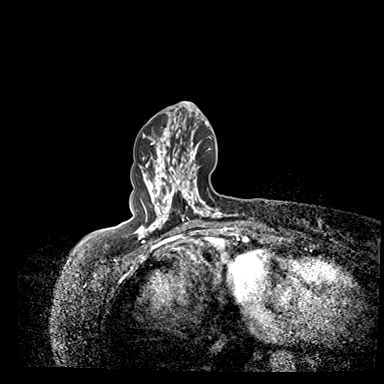
[im 86/144]
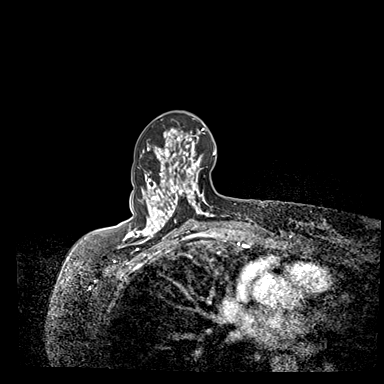
[im 101/144]
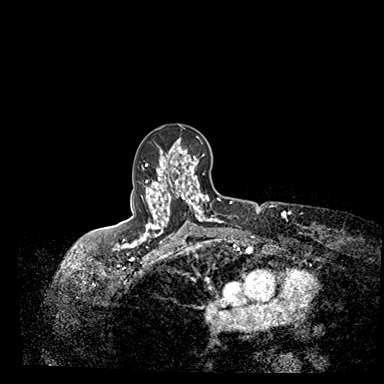
[im 115/144]
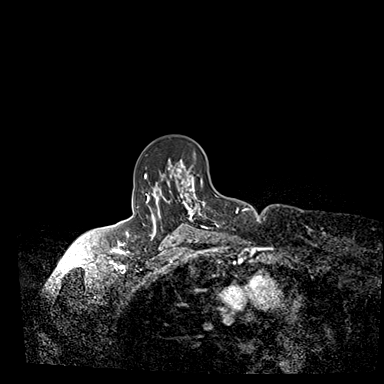
[im 129/144]
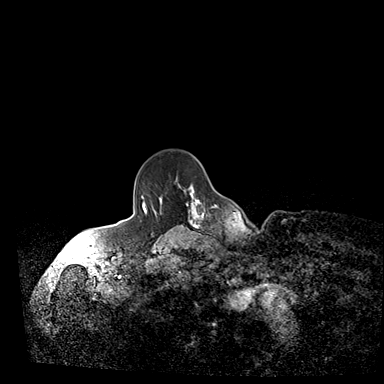
[im 144/144]
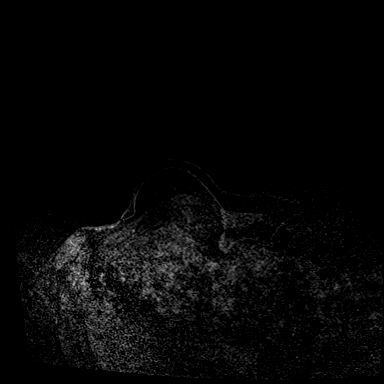

[Series 6: needle confirmation · axial · 1.3mm · 0.73mm/px · z∈[-82,+85]mm · 9 of 144 slices shown]
[im 1/144]
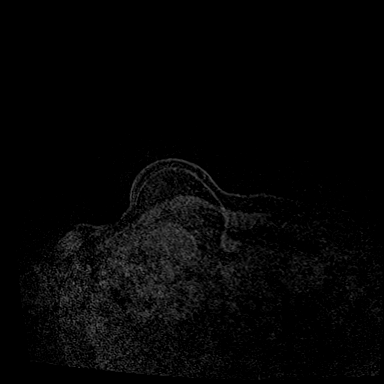
[im 15/144]
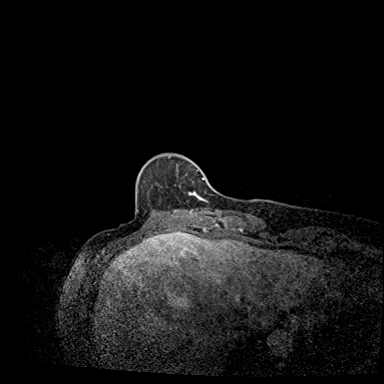
[im 29/144]
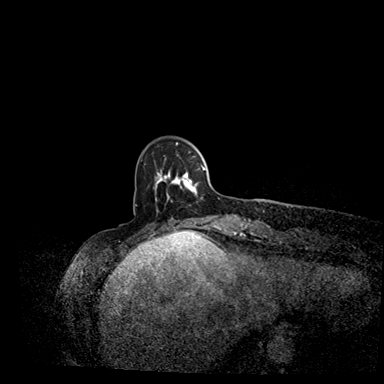
[im 43/144]
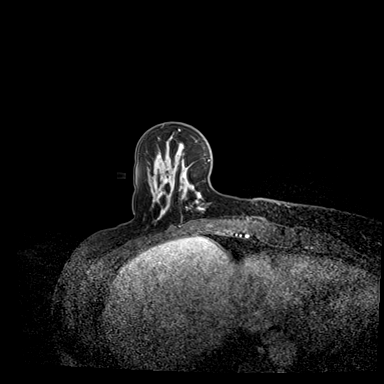
[im 58/144]
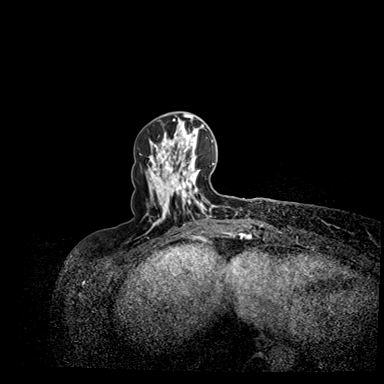
[im 72/144]
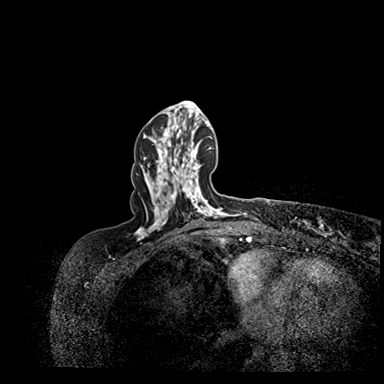
[im 86/144]
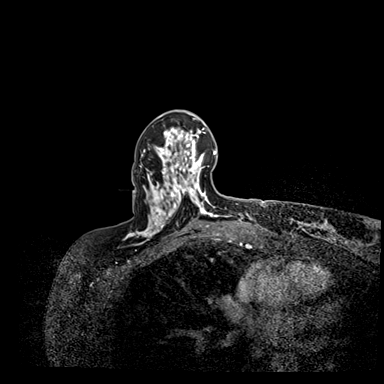
[im 101/144]
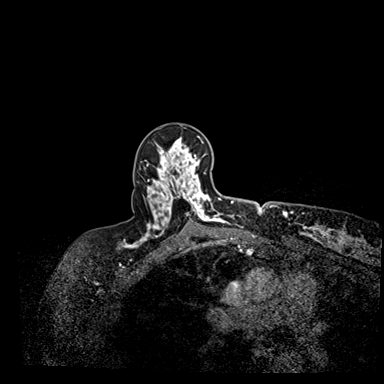
[im 129/144]
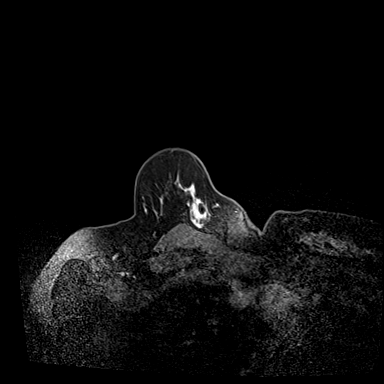

[35 of 48 positions shown; findings below may reference images not displayed]

FINDINGS: I met with the patient, and we discussed the procedure of MRI guided
biopsy, including risks, benefits, and alternatives. Specifically,
we discussed the risks of infection, bleeding, tissue injury, clip
migration, and inadequate sampling. Informed, written consent was
given. The usual time out protocol was performed immediately prior
to the procedure.

Using sterile technique, 1% Lidocaine, MRI guidance, and a 9 gauge
vacuum assisted device, biopsy was performed of a mass in the
upper-outer quadrant of the right breast at [DATE] using a lateral
approach. At the conclusion of the procedure, a dumbbell-shaped
tissue marker clip was deployed into the biopsy cavity. Follow-up
2-view mammogram was performed and dictated separately.
IMPRESSION: MRI guided biopsy of right breast mass at [DATE]. No apparent
complications.

## 2018-07-09 IMAGING — MG MM CLIP PLACEMENT
2 series · 2 of 2 positions shown · non-contrast
Comparison: Previous exam(s).

CLINICAL DATA: Post biopsy mammogram of the right breast for clip
placement.

EXAM:
DIAGNOSTIC RIGHT MAMMOGRAM POST MRI BIOPSY

[R CC]
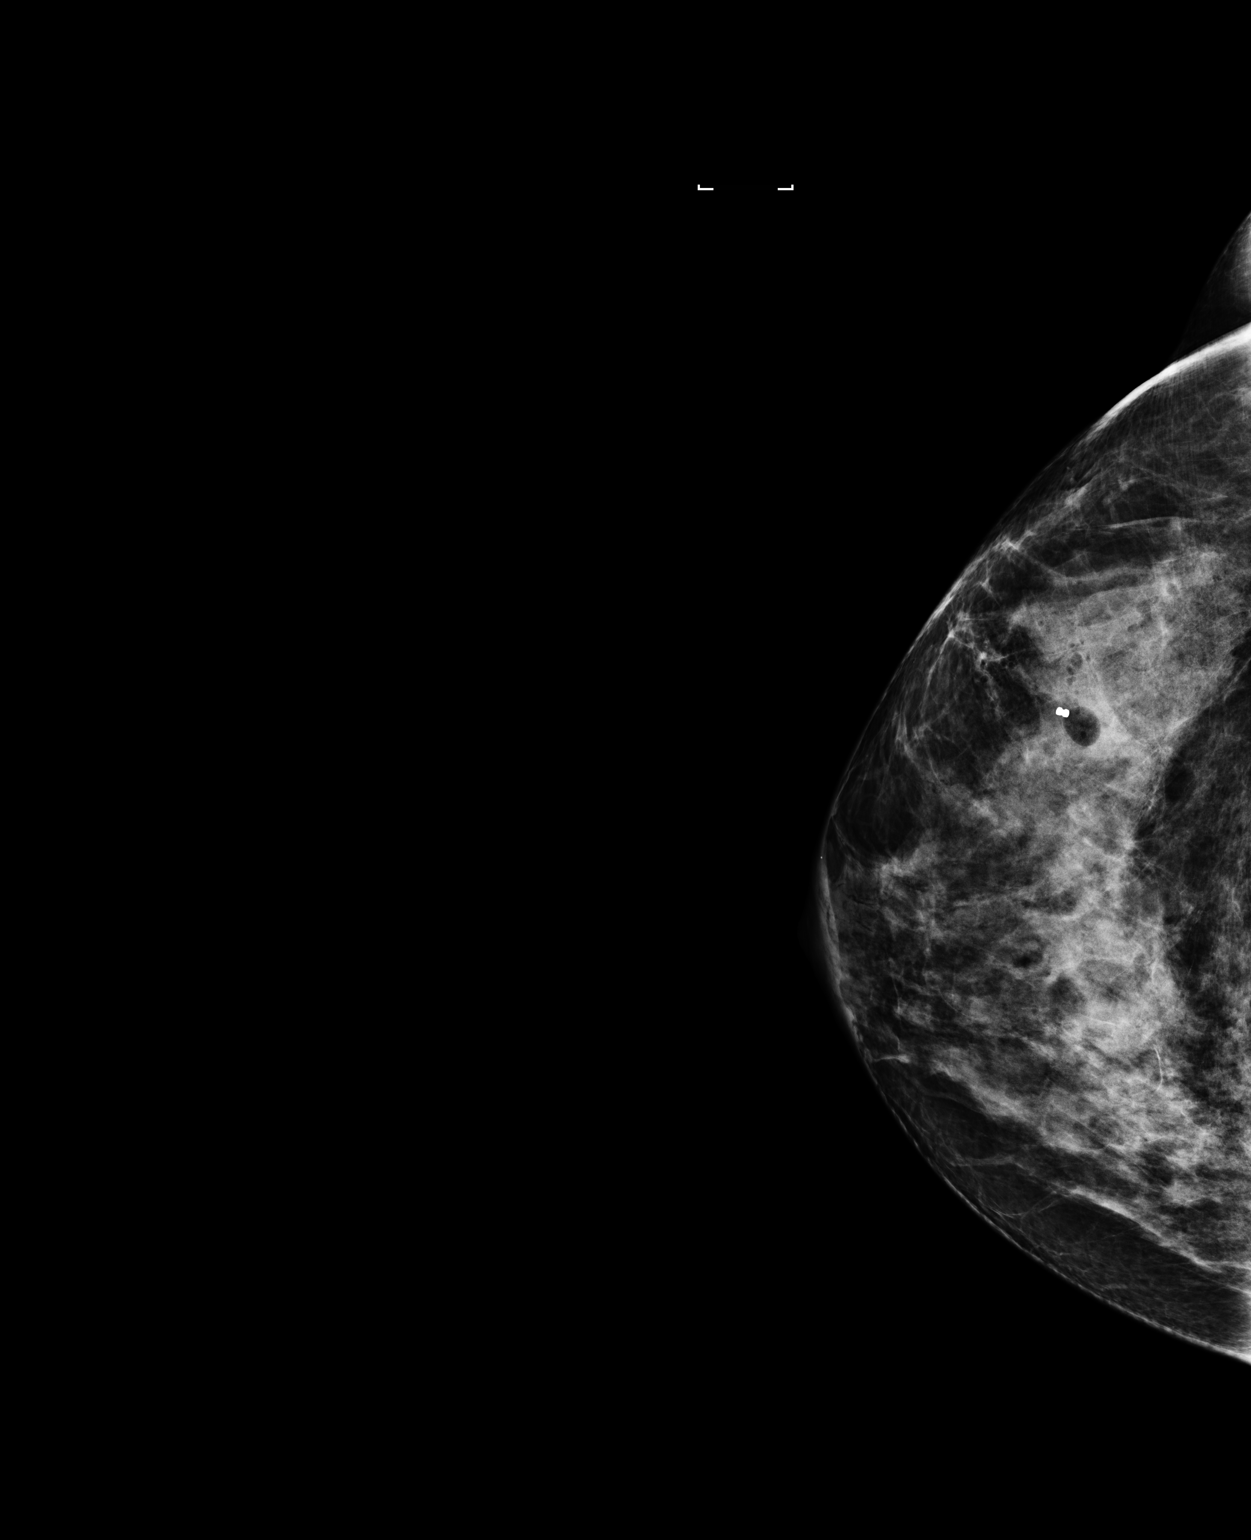

[R ML]
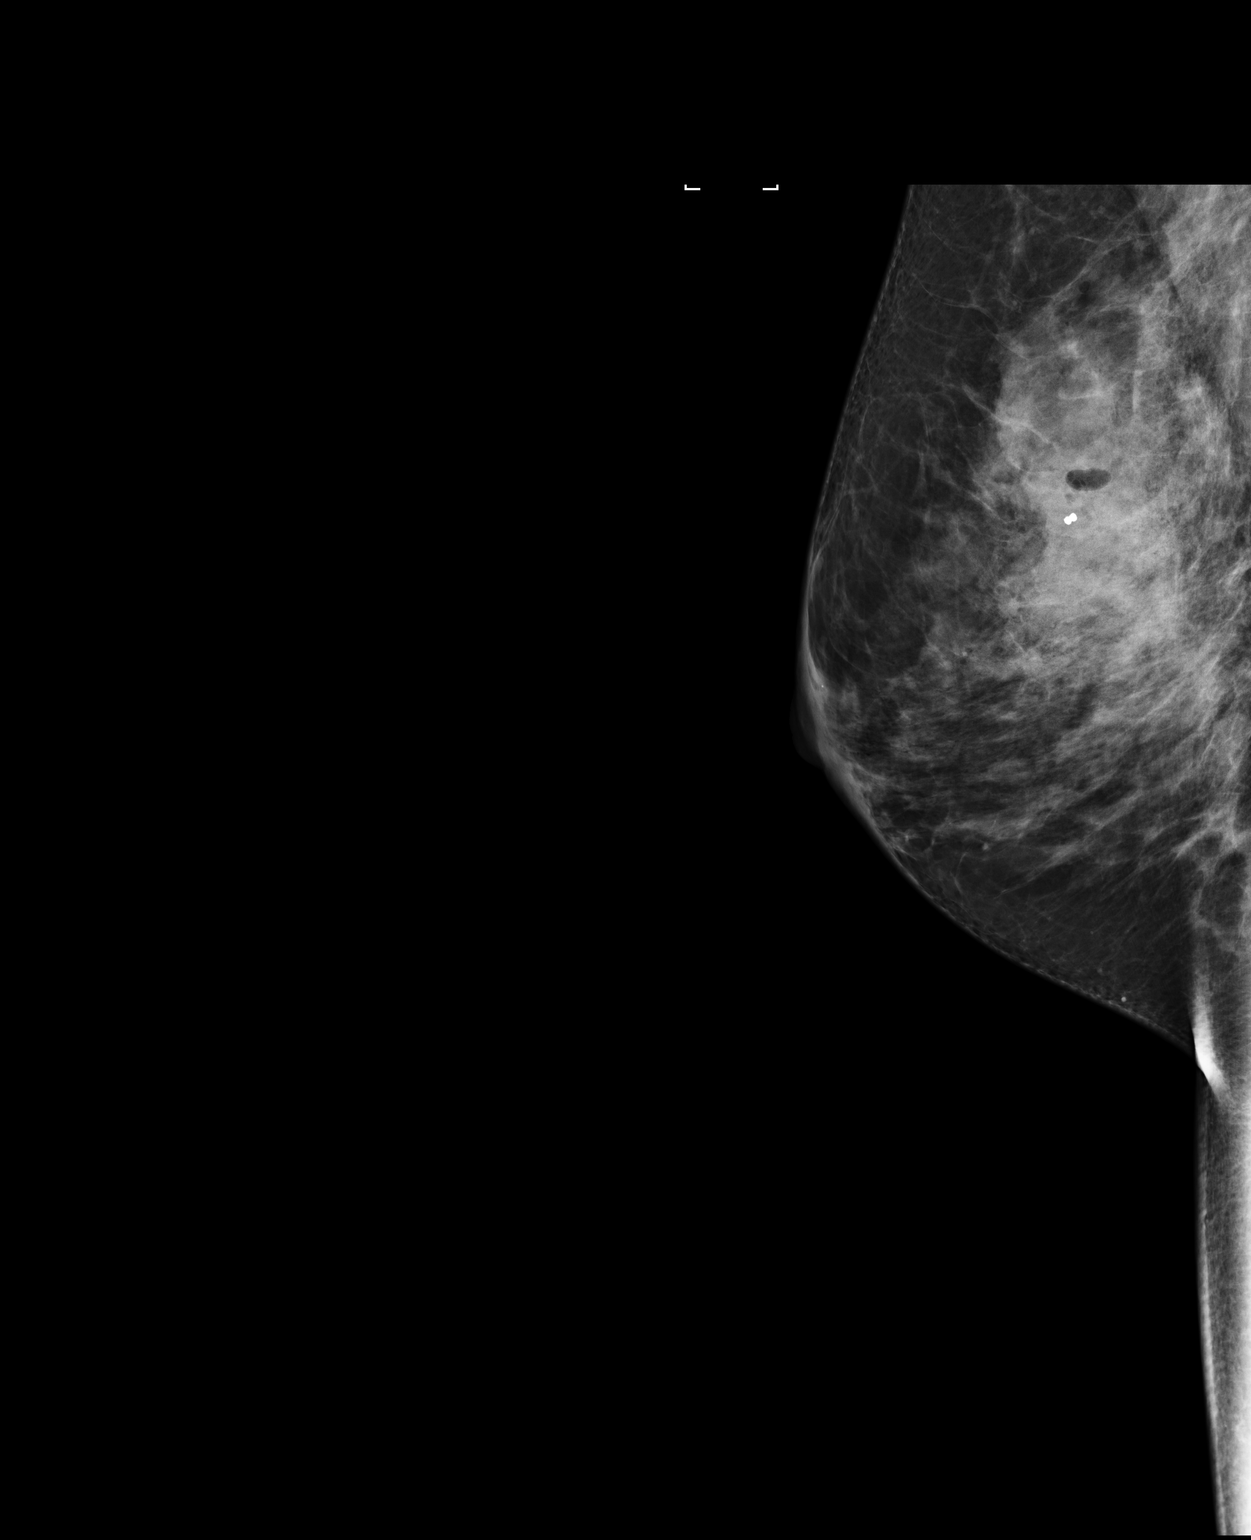

[2 of 2 positions shown; findings below may reference images not displayed]

FINDINGS: Mammographic images were obtained following MRI guided biopsy of a
right breast mass at [DATE]. The dumbbell-shaped biopsy marking clip
is well positioned in the upper outer quadrant of the right breast.
IMPRESSION: Appropriate positioning of the dumbbell-shaped biopsy marking clip
in the upper-outer quadrant of the right breast.

Final Assessment: Post Procedure Mammograms for Marker Placement

## 2018-08-08 ENCOUNTER — Other Ambulatory Visit: Payer: Self-pay | Admitting: Hematology

## 2018-08-28 NOTE — Progress Notes (Signed)
Pamela Whitney   [phone] [phone]   Clinic Follow up Note   Patient Care Team: Pamela Millers, MD as PCP - General (Obstetrics and Gynecology) Pamela Quitter, MD as Consulting Physician (Otolaryngology)  Date of Service:  09/01/2018  CHIEF COMPLAINT: Follow-up of bilateral stage I breast cancer  SUMMARY OF ONCOLOGIC HISTORY: Oncology History Overview Note  Cancer Staging Carcinoma of upper-outer quadrant of right breast in female, estrogen receptor positive ([place]) Staging form: Breast, AJCC 8th Edition - Pathologic stage from 01/18/2017: Stage IA (pT1a, pN0, cM0, G1, ER+, PR+, HER2-) - Signed by Pamela Merle, MD on 06/24/2017 - Clinical: Stage IA (cT1a, cN0(sn), cM0, G1, ER: Positive, PR: Positive, HER2: Negative) - Unsigned  Malignant neoplasm of lower-inner quadrant of left breast in female, estrogen receptor positive ([place]) Staging form: Breast, AJCC 8th Edition - Pathologic stage from 01/18/2017: Stage IA (pT1c, pN0(sn), cM0, G1, ER+, PR+, HER2-, Oncotype DX score: 14) - Signed by Pamela Merle, MD on 06/24/2017     Malignant neoplasm of lower-inner quadrant of left breast in female, estrogen receptor positive ([place])  12/14/2016 Mammogram   Diagnostic mammogram 12/14/16 IMPRESSION: Suspicious 9 x 7 x 9 mm irregular lobular hypoechoic left breast mass at the 630 o'clock 3 cm from the nipple, felt to correspond with mammographic focal architectural distortion. RECOMMENDATION: Ultrasound-guided core needle biopsy left breast mass.   12/18/2016 Initial Biopsy   Diagnosis 12/18/16 Breast, left, needle core biopsy, 6:30 o'clock - INVASIVE DUCTAL CARCINOMA, GRADE I.    12/18/2016 Receptors her2   Estrogen Receptor: 90%, POSITIVE, STRONG STAINING INTENSITY Progesterone Receptor: 90%, POSITIVE, STRONG STAINING INTENSITY Proliferation Marker Ki67: 5% HER2 NEGATIVE    12/20/2016 Initial Diagnosis   Malignant neoplasm of lower-inner quadrant of  left breast in female, estrogen receptor positive ([place])   01/07/2017 Genetic Testing   Patient had genetic testing due to a personal history of breeast cancer and a family history of prostate and colon cancer. The Common Hereditary Cancers Panel + Prostate Cancer Panelwas ordered. The Hereditary Gene Panel offered by [facility] includes sequencing and/or deletion duplication testing of the following 48 genes: APC, ATM, AXIN2, BARD1, BMPR1A, BRCA1, BRCA2, BRIP1, CDH1, CDKN2A (p14ARF), CDKN2A (p16INK4a), CKD4, CHEK2, CTNNA1, DICER1, EPCAM (Deletion/duplication testing only), FANCA, GREM1 (promoter region deletion/duplication testing only), KIT, MEN1, MLH1, MSH2, MSH3, MSH6, MUTYH, NBN, NF1, NHTL1, PALB2, PDGFRA, PMS2, POLD1, POLE, PTEN, RAD50, RAD51C, RAD51D, SDHB, SDHC, SDHD, SMAD4, SMARCA4. STK11, TP53, TSC1, TSC2, and VHL.  The following genes were evaluated for sequence changes only: SDHA and HOXB13 c.251G>A variant only.  Results- Negative, no pathogenic variants identified.  The date of this test report is 01/07/2017   01/18/2017 Surgery   Bilateral Breast lumpectomy with Dr. Donne Whitney    01/18/2017 Pathology Results    Diagnosis 01/18/17 1. Breast, lumpectomy, Left - INVASIVE DUCTAL CARCINOMA, GRADE I/III, SPANNING 1.2 CM. - DUCTAL CARCINOMA IN SITU, LOW GRADE. - THE SURGICAL RESECTION MARGINS ARE NEGATIVE FOR CARCINOMA. - SEE ONCOLOGY TABLE BELOW. 2. Breast, excision, Left additional deep margin - FIBROCYSTIC CHANGES WITH ADENOSIS. - USUAL DUCTAL HYPERPLASIA. - THERE IS NO EVIDENCE OF MALIGNANCY. - SEE COMMENT. 3. Breast, lumpectomy, Right - LOBULAR NEOPLASIA (LOBULAR CARCINOMA IN SITU). - SEE COMMENT. 4. Breast, excision, Right additional Posterior Lateral - INVASIVE LOBULAR CARCINOMA, GRADE I/III, SPANNING, SPANNING 0.4 CM. - THE SURGICAL RESECTION MARGINS ARE NEGATIVE FOR CARCINOMA. - SEE ONCOLOGY TABLE BELOW. 5. Lymph node, sentinel, biopsy, Right Axillary - THERE IS NO EVIDENCE  OF CARCINOMA IN  OF 1 LYMPH NODE (0/1). °- SEE COMMENT. °6. Lymph node, sentinel, biopsy, Left Axillary °- THERE IS NO EVIDENCE OF CARCINOMA IN 1 OF 1 LYMPH NODE (0/1). °- SEE COMMENT. °7. Breast, excision, Left additional Inferior Margin °- FIBROCYSTIC CHANGES. °- THERE IS NO EVIDENCE OF MALIGNANCY. °- SEE COMMENT. ° °  °01/18/2017 Oncotype testing  ° Her oncotype recurrence score is 14 and her distant recurrence on Tamoxifen is 10%. °  °03/12/2017 - 04/08/2017 Radiation Therapy  ° Radiation therapy with Dr. Moody °  °04/21/2017 -  Anti-estrogen oral therapy  ° 20 mg Tamoxifen daily  °  °12/05/2017 Imaging  ° Diagnostic bilateral mammogram °IMPRESSION: Interval bilateral posttreatment changes without mammographic evidence of malignancy. °  °12/20/2017 Imaging  ° Bilateral breast MRI  °IMPRESSION: Expected postoperative changes bilaterally. 5 millimeter mass in the upper inner quadrant of the left breast warranting tissue diagnosis.  °  °01/03/2018 Pathology Results  ° Breast, left, needle core biopsy, UIQ at posterior depth with fibrocystic changes. There is no evidence of malignancy. °  °Carcinoma of upper-outer quadrant of right breast in female, estrogen receptor positive ([facility])  °01/18/2017 Cancer Staging  ° Staging form: Breast, AJCC 8th Edition °- Pathologic stage from 01/18/2017: Stage IA (pT1a, pN0, cM0, G1, ER+, PR+, HER2-) - Signed by Feng, Yan, MD on 06/24/2017 °  °02/12/2017 Initial Diagnosis  ° Carcinoma of upper-outer quadrant of right breast in female, estrogen receptor positive ([facility]) °  ° ° ° °CURRENT THERAPY:  °Tamoxifen 20 mg daily started on 04/21/17 ° °INTERVAL HISTORY:  °Pamela Whitney is here for a follow up of right breast cancer. She was last seen by me 6 months ago. presents to the clinic alone. She notes she is doing well. She has menopause symptoms and her last period was 08/2017. She has been having hot flashes and not sleeping but 1-2.5 hours. She does not feel tired during the day. She starting  to have joint pain in her fingers up to her arms. She notes her fingers swell. She has tightness under her right arm. She has soreness under her right breast. She felt knots there before but doe snot feel it now. She denies any skin change there. She still has skin hyperpigmentation from the radiation.  ° ° ° °REVIEW OF SYSTEMS:   °Constitutional: Denies fevers, chills or abnormal weight loss (+) significant hot flashes (+) insomnia  °Eyes: Denies blurriness of vision °Ears, nose, mouth, throat, and face: Denies mucositis or sore throat °Respiratory: Denies cough, dyspnea or wheezes °Cardiovascular: Denies palpitation, chest discomfort or lower extremity swelling °Gastrointestinal:  Denies nausea, heartburn or change in bowel habits °MKS: (+) joint pain and soreness with swelling from fingers to lower arms  °Skin: Denies abnormal skin rashes °Lymphatics: Denies new lymphadenopathy or easy bruising °Neurological:Denies numbness, tingling or new weaknesses °Behavioral/Psych: Mood is stable, no new changes  °Breast: (+) right breast soreness  °All other systems were reviewed with the patient and are negative. ° °MEDICAL HISTORY:  °Past Medical History:  °Diagnosis Date  °• Allergy   ° PCNS  °• Breast cancer ([facility]) 01/2017  ° Bilateral  °• Family history of colon cancer   °• Family history of prostate cancer in father   ° ° °SURGICAL HISTORY: °Past Surgical History:  °Procedure Laterality Date  °• CESAREAN SECTION    °• RADIOACTIVE SEED GUIDED PARTIAL MASTECTOMY WITH AXILLARY SENTINEL LYMPH NODE BIOPSY Bilateral 01/18/2017  ° Procedure: BILATERAL BREAST LUMPECTOMY WITH  BILATERAL RADIOACTIVE SEED AND BILATERAL SENTINEL   SENTINEL LYMPH NODE BIOPSY;  Surgeon: Rolm Bookbinder, MD;  Location: [facility];  Service: General;  Laterality: Bilateral;   RHINOPLASTY     WISDOM TOOTH EXTRACTION      I have reviewed the social history and family history with the patient and they are unchanged from previous  note.  ALLERGIES:  is allergic to penicillins.  MEDICATIONS:  Current Outpatient Medications  Medication Sig Dispense Refill   ibuprofen (ADVIL,MOTRIN) 200 MG tablet Take 200 mg every 6 (six) hours as needed by mouth.     tamoxifen (NOLVADEX) 20 MG tablet TAKE 1 TABLET(20 MG) BY MOUTH DAILY 90 tablet 1   gabapentin (NEURONTIN) 100 MG capsule Take 1 capsule (100 mg total) by mouth at bedtime. Increase to two capsules in week 2 and three capsules in week 3 if tolerates well 60 capsule 1   No current facility-administered medications for this visit.     PHYSICAL EXAMINATION: ECOG PERFORMANCE STATUS: 0 - Asymptomatic  Vitals:   09/01/18 1513  BP: 117/81  Pulse: 80  Resp: 18  Temp: 99.6 F (37.6 C)  SpO2: 100%   Filed Weights   09/01/18 1513  Weight: 145 lb 6.4 oz (66 kg)    GENERAL:alert, no distress and comfortable SKIN: skin color, texture, turgor are normal, no rashes or significant lesions EYES: normal, Conjunctiva are pink and non-injected, sclera clear  NECK: supple, thyroid normal size, non-tender, without nodularity LYMPH:  no palpable lymphadenopathy in the cervical, axillary  LUNGS: clear to auscultation and percussion with normal breathing effort HEART: regular rate & rhythm and no murmurs and no lower extremity edema ABDOMEN:abdomen soft, non-tender and normal bowel sounds Musculoskeletal:no cyanosis of digits and no clubbing  NEURO: alert & oriented x 3 with fluent speech, no focal motor/sensory deficits BREAST: S/p b/l lumpectomies: Surgical incision healed with left breast scar tissue and tenderness (+) mild lymphedema of her left breast. (+) No palpable mass or adenopathy of either breasts.    LABORATORY DATA:  I have reviewed the data as listed CBC Latest Ref Rng & Units 09/01/2018 02/24/2018 10/31/2017  WBC 4.0 - 10.5 K/uL 5.1 4.0 5.4  Hemoglobin 12.0 - 15.0 g/dL 12.0 12.5 12.2  Hematocrit 36.0 - 46.0 % 34.9(L) 36.9 35.6  Platelets 150 - 400 K/uL 199  179 231     CMP Latest Ref Rng & Units 09/01/2018 02/24/2018 10/31/2017  Glucose 70 - 99 mg/dL 99 106(H) 108(H)  BUN 6 - 20 mg/dL _0 Creatinine 0.44 - 1.00 mg/dL 0.91 0.93 0.96  Sodium 135 - 145 mmol/L 142 140 142  Potassium 3.5 - 5.1 mmol/L 4.0 4.3 4.1  Chloride 98 - 111 mmol/L 106 106 106  CO2 22 - 32 mmol/L _1 Calcium 8.9 - 10.3 mg/dL 8.9 9.0 9.2  Total Protein 6.5 - 8.1 g/dL 7.2 7.1 7.1  Total Bilirubin 0.3 - 1.2 mg/dL 0.3 0.5 0.3  Alkaline Phos 38 - 126 U/L 54 57 58  AST 15 - 41 U/L 36 29 29  ALT 0 - 44 U/L 37 41 35      RADIOGRAPHIC STUDIES: I have personally reviewed the radiological images as listed and agreed with the findings in the report. No results found.   ASSESSMENT & PLAN:  Pamela Whitney is a 53 y.o. female with   1. 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 She is s/p b/l lumpecomty and adjuvant radiation.  °-Her oncotype recurrence score is 14 and her 10-year distant recurrence on Tamoxifen is 10%. Due to low risk disease, adjuvant chemotherapy was not recommended. °-She started Tamoxifen on 04/21/17 and has been tolerating moderately well with hot flashes and insomnia.  °-She also notes having shooting left breast pain, joint pain and soreness of upper extremity.  °-It has been over 1 year (in 08/2017) since her last period. I will test her hormonal level to determine if she is truly  postmenopausal and switched her to AI. I reviewed side effects of AI with her in great detail. °-She is clinically doing well. Lab reviewed, her CBC and CMP are within normal limits. Her physical exam and was unremarkable with mild left breast tenderness at incision site. There is no clinical concern  for recurrence. °-Continue Tamoxifen  °-F/u in 3-4 months  °  °2. Genetic Testing was negative °  °  °3. Insomnia from Hot flashes  °-She continues to have insomnia which has worsened from worsening hot flashes.  °-Her last period was 08/2017. Her hot flashes are likely related to her menopause and Tamoxifen.  °-She tried melatonin, without relief.  °-I discussed SSRI or Gabapentin to help control her hot flashes to improve her sleep. She opted to try Gabapentin 100mg first at night. She can titrate up as needed.  ° °4. Joint pain and soreness of upper extremity, right flank soreness   °-Possibly secondary to Tamoxifen or prior surgery.  °-Her exam was benign today, will monitor.  °-I discussed given her b/l lumpectomies her arms may be more sensitive and reactive.  °-I recommend she exercise and increase her joint pain activity. She is agreeable.  ° °  °  °PLAN:   °-I called in Gabapentin today for her hot flush  °-Continue Tamoxifen  °Lab and f/u in 3-4 months  ° ° °No problem-specific Assessment & Plan notes found for this encounter. ° ° °No orders of the defined types were placed in this encounter. ° °All questions were answered. The patient knows to call the clinic with any problems, questions or concerns. No barriers to learning was detected. °I spent 20 minutes counseling the patient face to face. The total time spent in the appointment was 25 minutes and more than 50% was on counseling and review of test results ° °  ° Whitney Feng, MD °09/01/2018  ° °I, Amoya Bennett, am acting as scribe for Whitney Feng, MD.  ° °I have reviewed the above documentation for accuracy and completeness, and I agree with the above. °  ° ° ° ° °

## 2018-08-29 ENCOUNTER — Telehealth: Payer: Self-pay

## 2018-08-29 NOTE — Telephone Encounter (Signed)
Called and left voicemail regarding pre-screening questions for appt on 6/29  

## 2018-09-01 ENCOUNTER — Other Ambulatory Visit: Payer: Self-pay

## 2018-09-01 ENCOUNTER — Inpatient Hospital Stay (HOSPITAL_BASED_OUTPATIENT_CLINIC_OR_DEPARTMENT_OTHER): Payer: 59 | Admitting: Hematology

## 2018-09-01 ENCOUNTER — Inpatient Hospital Stay: Payer: 59 | Attending: Hematology

## 2018-09-01 VITALS — BP 117/81 | HR 80 | Temp 99.6°F | Resp 18 | Ht 64.5 in | Wt 145.4 lb

## 2018-09-01 DIAGNOSIS — C50312 Malignant neoplasm of lower-inner quadrant of left female breast: Secondary | ICD-10-CM | POA: Insufficient documentation

## 2018-09-01 DIAGNOSIS — Z79810 Long term (current) use of selective estrogen receptor modulators (SERMs): Secondary | ICD-10-CM

## 2018-09-01 DIAGNOSIS — G4700 Insomnia, unspecified: Secondary | ICD-10-CM

## 2018-09-01 DIAGNOSIS — Z170 Estrogen receptor positive status [ER+]: Secondary | ICD-10-CM

## 2018-09-01 DIAGNOSIS — C50411 Malignant neoplasm of upper-outer quadrant of right female breast: Secondary | ICD-10-CM | POA: Insufficient documentation

## 2018-09-01 DIAGNOSIS — M2550 Pain in unspecified joint: Secondary | ICD-10-CM | POA: Insufficient documentation

## 2018-09-01 DIAGNOSIS — N951 Menopausal and female climacteric states: Secondary | ICD-10-CM | POA: Insufficient documentation

## 2018-09-01 LAB — CBC WITH DIFFERENTIAL (CANCER CENTER ONLY)
Abs Immature Granulocytes: 0.02 10*3/uL (ref 0.00–0.07)
Basophils Absolute: 0 10*3/uL (ref 0.0–0.1)
Basophils Relative: 1 %
Eosinophils Absolute: 0.1 10*3/uL (ref 0.0–0.5)
Eosinophils Relative: 2 %
HCT: 34.9 % — ABNORMAL LOW (ref 36.0–46.0)
Hemoglobin: 12 g/dL (ref 12.0–15.0)
Immature Granulocytes: 0 %
Lymphocytes Relative: 35 %
Lymphs Abs: 1.8 10*3/uL (ref 0.7–4.0)
MCH: 32.3 pg (ref 26.0–34.0)
MCHC: 34.4 g/dL (ref 30.0–36.0)
MCV: 94.1 fL (ref 80.0–100.0)
Monocytes Absolute: 0.4 10*3/uL (ref 0.1–1.0)
Monocytes Relative: 8 %
Neutro Abs: 2.8 10*3/uL (ref 1.7–7.7)
Neutrophils Relative %: 54 %
Platelet Count: 199 10*3/uL (ref 150–400)
RBC: 3.71 MIL/uL — ABNORMAL LOW (ref 3.87–5.11)
RDW: 11.9 % (ref 11.5–15.5)
WBC Count: 5.1 10*3/uL (ref 4.0–10.5)
nRBC: 0 % (ref 0.0–0.2)

## 2018-09-01 LAB — CMP (CANCER CENTER ONLY)
ALT: 37 U/L (ref 0–44)
AST: 36 U/L (ref 15–41)
Albumin: 3.8 g/dL (ref 3.5–5.0)
Alkaline Phosphatase: 54 U/L (ref 38–126)
Anion gap: 9 (ref 5–15)
BUN: 17 mg/dL (ref 6–20)
CO2: 27 mmol/L (ref 22–32)
Calcium: 8.9 mg/dL (ref 8.9–10.3)
Chloride: 106 mmol/L (ref 98–111)
Creatinine: 0.91 mg/dL (ref 0.44–1.00)
GFR, Est AFR Am: >60 mL/min (ref >60)
GFR, Estimated: >60 mL/min (ref >60)
Glucose, Bld: 99 mg/dL (ref 70–99)
Potassium: 4 mmol/L (ref 3.5–5.1)
Sodium: 142 mmol/L (ref 135–145)
Total Bilirubin: 0.3 mg/dL (ref 0.3–1.2)
Total Protein: 7.2 g/dL (ref 6.5–8.1)

## 2018-09-01 MED ORDER — GABAPENTIN 100 MG PO CAPS: 100.0000 mg | ORAL_CAPSULE | Freq: Every day | ORAL | 1 refills | Status: DC

## 2018-09-02 ENCOUNTER — Telehealth: Payer: Self-pay | Admitting: Hematology

## 2018-09-02 NOTE — Telephone Encounter (Signed)
Scheduled appt per 6/29 los. Printed and mailed calendar. °

## 2018-09-04 ENCOUNTER — Encounter: Payer: Self-pay | Admitting: Hematology

## 2018-11-07 ENCOUNTER — Other Ambulatory Visit: Payer: Self-pay | Admitting: Obstetrics and Gynecology

## 2018-11-07 DIAGNOSIS — Z1231 Encounter for screening mammogram for malignant neoplasm of breast: Secondary | ICD-10-CM

## 2018-12-04 NOTE — Progress Notes (Signed)
[place]   [phone] [phone]   Clinic Follow up Note   Patient Care Team: Olga Millers, MD as PCP - General (Obstetrics and Gynecology) Melida Quitter, MD as Consulting Physician (Otolaryngology)  Date of Service:  12/08/2018  CHIEF COMPLAINT: Follow-up of bilateral stage I breast cancer  SUMMARY OF ONCOLOGIC HISTORY: Oncology History Overview Note  Cancer Staging Carcinoma of upper-outer quadrant of right breast in female, estrogen receptor positive ([place]) Staging form: Breast, AJCC 8th Edition - Pathologic stage from 01/18/2017: Stage IA (pT1a, pN0, cM0, G1, ER+, PR+, HER2-) - Signed by Truitt Merle, MD on 06/24/2017 - Clinical: Stage IA (cT1a, cN0(sn), cM0, G1, ER: Positive, PR: Positive, HER2: Negative) - Unsigned  Malignant neoplasm of lower-inner quadrant of left breast in female, estrogen receptor positive ([place]) Staging form: Breast, AJCC 8th Edition - Pathologic stage from 01/18/2017: Stage IA (pT1c, pN0(sn), cM0, G1, ER+, PR+, HER2-, Oncotype DX score: 14) - Signed by Truitt Merle, MD on 06/24/2017     Malignant neoplasm of lower-inner quadrant of left breast in female, estrogen receptor positive ([place])  12/14/2016 Mammogram   Diagnostic mammogram 12/14/16 IMPRESSION: Suspicious 9 x 7 x 9 mm irregular lobular hypoechoic left breast mass at the 630 o'clock 3 cm from the nipple, felt to correspond with mammographic focal architectural distortion. RECOMMENDATION: Ultrasound-guided core needle biopsy left breast mass.   12/18/2016 Initial Biopsy   Diagnosis 12/18/16 Breast, left, needle core biopsy, 6:30 o'clock - INVASIVE DUCTAL CARCINOMA, GRADE I.    12/18/2016 Receptors her2   Estrogen Receptor: 90%, POSITIVE, STRONG STAINING INTENSITY Progesterone Receptor: 90%, POSITIVE, STRONG STAINING INTENSITY Proliferation Marker Ki67: 5% HER2 NEGATIVE    12/20/2016 Initial Diagnosis   Malignant neoplasm of lower-inner quadrant of  left breast in female, estrogen receptor positive ([place])   01/07/2017 Genetic Testing   Patient had genetic testing due to a personal history of breeast cancer and a family history of prostate and colon cancer. The Common Hereditary Cancers Panel + Prostate Cancer Panelwas ordered. The Hereditary Gene Panel offered by [facility] includes sequencing and/or deletion duplication testing of the following 48 genes: APC, ATM, AXIN2, BARD1, BMPR1A, BRCA1, BRCA2, BRIP1, CDH1, CDKN2A (p14ARF), CDKN2A (p16INK4a), CKD4, CHEK2, CTNNA1, DICER1, EPCAM (Deletion/duplication testing only), FANCA, GREM1 (promoter region deletion/duplication testing only), KIT, MEN1, MLH1, MSH2, MSH3, MSH6, MUTYH, NBN, NF1, NHTL1, PALB2, PDGFRA, PMS2, POLD1, POLE, PTEN, RAD50, RAD51C, RAD51D, SDHB, SDHC, SDHD, SMAD4, SMARCA4. STK11, TP53, TSC1, TSC2, and VHL.  The following genes were evaluated for sequence changes only: SDHA and HOXB13 c.251G>A variant only.  Results- Negative, no pathogenic variants identified.  The date of this test report is 01/07/2017   01/18/2017 Surgery   Bilateral Breast lumpectomy with Dr. Donne Hazel    01/18/2017 Pathology Results    Diagnosis 01/18/17 1. Breast, lumpectomy, Left - INVASIVE DUCTAL CARCINOMA, GRADE I/III, SPANNING 1.2 CM. - DUCTAL CARCINOMA IN SITU, LOW GRADE. - THE SURGICAL RESECTION MARGINS ARE NEGATIVE FOR CARCINOMA. - SEE ONCOLOGY TABLE BELOW. 2. Breast, excision, Left additional deep margin - FIBROCYSTIC CHANGES WITH ADENOSIS. - USUAL DUCTAL HYPERPLASIA. - THERE IS NO EVIDENCE OF MALIGNANCY. - SEE COMMENT. 3. Breast, lumpectomy, Right - LOBULAR NEOPLASIA (LOBULAR CARCINOMA IN SITU). - SEE COMMENT. 4. Breast, excision, Right additional Posterior Lateral - INVASIVE LOBULAR CARCINOMA, GRADE I/III, SPANNING, SPANNING 0.4 CM. - THE SURGICAL RESECTION MARGINS ARE NEGATIVE FOR CARCINOMA. - SEE ONCOLOGY TABLE BELOW. 5. Lymph node, sentinel, biopsy, Right Axillary - THERE IS NO EVIDENCE  OF CARCINOMA IN 1  OF 1 LYMPH NODE (0/1). - SEE COMMENT. 6. Lymph node, sentinel, biopsy, Left Axillary - THERE IS NO EVIDENCE OF CARCINOMA IN 1 OF 1 LYMPH NODE (0/1). - SEE COMMENT. 7. Breast, excision, Left additional Inferior Margin - FIBROCYSTIC CHANGES. - THERE IS NO EVIDENCE OF MALIGNANCY. - SEE COMMENT.    01/18/2017 Oncotype testing   Her oncotype recurrence score is 14 and her distant recurrence on Tamoxifen is 10%.   03/12/2017 - 04/08/2017 Radiation Therapy   Radiation therapy with Dr. Lisbeth Renshaw   04/21/2017 -  Anti-estrogen oral therapy   20 mg Tamoxifen daily    12/05/2017 Imaging   Diagnostic bilateral mammogram IMPRESSION: Interval bilateral posttreatment changes without mammographic evidence of malignancy.   12/20/2017 Imaging   Bilateral breast MRI  IMPRESSION: Expected postoperative changes bilaterally. 5 millimeter mass in the upper inner quadrant of the left breast warranting tissue diagnosis.    01/03/2018 Pathology Results   Breast, left, needle core biopsy, UIQ at posterior depth with fibrocystic changes. There is no evidence of malignancy.   Carcinoma of upper-outer quadrant of right breast in female, estrogen receptor positive ([place])  01/18/2017 Cancer Staging   Staging form: Breast, AJCC 8th Edition - Pathologic stage from 01/18/2017: Stage IA (pT1a, pN0, cM0, G1, ER+, PR+, HER2-) - Signed by Truitt Merle, MD on 06/24/2017   02/12/2017 Initial Diagnosis   Carcinoma of upper-outer quadrant of right breast in female, estrogen receptor positive ([place])      CURRENT THERAPY:  Tamoxifen 20 mg daily started on 04/21/17  INTERVAL HISTORY:  Pamela Whitney is here for a follow up right breast cancer. She presents to the clinic alone. She notes she is doing well. She notes trouble sleeping due to hot flashes. She tried Gabapentin but stopped after 3 days. She felt sick with fever and didn't feel good around that time as well. She also notes weight gain even when trying to  lose weight. She notes she otherwise tolerates Tamoxifen. She uses standing desk, reduced food intake, walking 30 minutes a day. She notes shooting left breast pain occasionally from surgery. She notes having joint pain in her hands at time.   REVIEW OF SYSTEMS:   Constitutional: Denies fevers, chills (+) Weight gain  Eyes: Denies blurriness of vision Ears, nose, mouth, throat, and face: Denies mucositis or sore throat Respiratory: Denies cough, dyspnea or wheezes Cardiovascular: Denies palpitation, chest discomfort or lower extremity swelling Gastrointestinal:  Denies nausea, heartburn or change in bowel habits Skin: Denies abnormal skin rashes MSK: (+)Joint pain in hands.  Lymphatics: Denies new lymphadenopathy or easy bruising Neurological:Denies numbness, tingling or new weaknesses Behavioral/Psych: Mood is stable, no new changes  Breast: (+) Left breast shooting pain  All other systems were reviewed with the patient and are negative.  MEDICAL HISTORY:  Past Medical History:  Diagnosis Date   Allergy    PCNS   Breast cancer ([place]) 01/2017   Bilateral   Family history of colon cancer    Family history of prostate cancer in father     SURGICAL HISTORY: Past Surgical History:  Procedure Laterality Date   CESAREAN SECTION     RADIOACTIVE SEED GUIDED PARTIAL MASTECTOMY WITH AXILLARY SENTINEL LYMPH NODE BIOPSY Bilateral 01/18/2017   Procedure: BILATERAL BREAST LUMPECTOMY WITH  BILATERAL RADIOACTIVE SEED AND BILATERAL SENTINEL LYMPH NODE BIOPSY;  Surgeon: Rolm Bookbinder, MD;  Location: [facility];  Service: General;  Laterality: Bilateral;   RHINOPLASTY     WISDOM TOOTH EXTRACTION  I have reviewed the social history and family history with the patient and they are unchanged from previous note.  ALLERGIES:  is allergic to penicillins.  MEDICATIONS:  Current Outpatient Medications  Medication Sig Dispense Refill   gabapentin (NEURONTIN) 100 MG  capsule Take 1 capsule (100 mg total) by mouth at bedtime. Increase to two capsules in week 2 and three capsules in week 3 if tolerates well 60 capsule 1   ibuprofen (ADVIL,MOTRIN) 200 MG tablet Take 200 mg every 6 (six) hours as needed by mouth.     tamoxifen (NOLVADEX) 20 MG tablet TAKE 1 TABLET(20 MG) BY MOUTH DAILY 90 tablet 1   No current facility-administered medications for this visit.     PHYSICAL EXAMINATION: ECOG PERFORMANCE STATUS: 0 - Asymptomatic  Vitals:   12/08/18 1528  BP: 103/73  Pulse: 88  Resp: 18  Temp: 98.2 F (36.8 C)  SpO2: 98%   Filed Weights   12/08/18 1528  Weight: 151 lb (68.5 kg)    GENERAL:alert, no distress and comfortable SKIN: skin color, texture, turgor are normal, no rashes or significant lesions EYES: normal, Conjunctiva are pink and non-injected, sclera clear  NECK: supple, thyroid normal size, non-tender, without nodularity LYMPH:  no palpable lymphadenopathy in the cervical, axillary  LUNGS: clear to auscultation and percussion with normal breathing effort HEART: regular rate & rhythm and no murmurs and no lower extremity edema ABDOMEN:abdomen soft, non-tender and normal bowel sounds Musculoskeletal:no cyanosis of digits and no clubbing  NEURO: alert & oriented x 3 with fluent speech, no focal motor/sensory deficits BREAST: S/p b/l lumpectomy: Surgical incision healed well (+) Mild scar tissue of right breast. No palpable mass, nodules or adenopathy bilaterally. Breast exam benign.   LABORATORY DATA:  I have reviewed the data as listed CBC Latest Ref Rng & Units 12/08/2018 09/01/2018 02/24/2018  WBC 4.0 - 10.5 K/uL 5.6 5.1 4.0  Hemoglobin 12.0 - 15.0 g/dL 12.3 12.0 12.5  Hematocrit 36.0 - 46.0 % 36.0 34.9(L) 36.9  Platelets 150 - 400 K/uL 203 199 179     CMP Latest Ref Rng & Units 12/08/2018 09/01/2018 02/24/2018  Glucose 70 - 99 mg/dL 95 99 106(H)  BUN 6 - 20 mg/dL '17 17 10  '$ Creatinine 0.44 - 1.00 mg/dL 1.15(H) 0.91 0.93  Sodium  135 - 145 mmol/L 142 142 140  Potassium 3.5 - 5.1 mmol/L 3.7 4.0 4.3  Chloride 98 - 111 mmol/L 107 106 106  CO2 22 - 32 mmol/L '27 27 27  '$ Calcium 8.9 - 10.3 mg/dL 8.4(L) 8.9 9.0  Total Protein 6.5 - 8.1 g/dL 7.0 7.2 7.1  Total Bilirubin 0.3 - 1.2 mg/dL 0.3 0.3 0.5  Alkaline Phos 38 - 126 U/L 58 54 57  AST 15 - 41 U/L 24 36 29  ALT 0 - 44 U/L 26 37 41      RADIOGRAPHIC STUDIES: I have personally reviewed the radiological images as listed and agreed with the findings in the report. No results found.   ASSESSMENT & PLAN:  DANENE MONTIJO is a 53 y.o. female with   1. 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 She is s/p b/l lumpecotmy and adjuvant radiation.  -Her Oncotype recurrence score is 14 and her 10-year distant recurrence on Tamoxifen is  10%.Due to low risk disease, adjuvant chemotherapy was not recommended. -She started Tamoxifen on 04/21/17 and has been tolerating moderately well with hot flashes and insomnia and weight gain.  -Her FSH and Estradial levels are still pending from today. If found to be post-menopausal I discussed switching her to AI.  -From a breast cancer standpoint she is clinically doing well. Labs reviewed, CBC and CMP WNL except Cr 1.15, Ca 8.4. Physical exam benign. There is no clinic concern for recurrence.  -I encouraged her to drink more water. She notes she has occasional leg cramps. I encouraged her to start calcium and Vitamin D supplements. -Continue surveillance. Next mammogram on 12/22/18  -continue Tamoxifen for now  -F/u in 6 months. If she changes to AI may do virtual visit in 3 months   2. Genetic Testing was negative    3. Insomnia from Hot flashes, Weight  gain -She continues to have insomnia which has worsened from worsening hot flashes.  -Her last period was 08/2017. Her hot flashes are likely related to her perimenopause and Tamoxifen.  -She tried melatonin, without relief. She can increase dose or try benadryl. I reviewed sleep hygiene with her.  -I previously called in Gabapentin and she only tried for 3 days before stopping. She is willing to try again.  -I also discussed her hot flashes can worsen as she enters menopause. ' -discussed her option of SSRI if her hot flashes do not improve. She will think about it.  -She has tried watching her diet and exercise without any weight loss. I discussed Tamoxifen can slow down her metabolism and increase her body weight.      PLAN: -She is clinically doing well.  -Continue Tamoxifen, if her lab shows she is postmenopausal, I will change her to letrozole  -Lab and f/u in 6 months  No problem-specific Assessment & Plan notes found for this encounter.   No orders of the defined types were placed in this encounter.  All questions were answered. The patient knows to call the clinic with any problems, questions or concerns. No barriers to learning was detected. I spent 15 minutes counseling the patient face to face. The total time spent in the appointment was 20 minutes and more than 50% was on counseling and review of test results     Truitt Merle, MD 12/08/2018   I, Joslyn Devon, am acting as scribe for Truitt Merle, MD.   I have reviewed the above documentation for accuracy and completeness, and I agree with the above.

## 2018-12-07 ENCOUNTER — Other Ambulatory Visit: Payer: Self-pay | Admitting: Hematology

## 2018-12-07 DIAGNOSIS — C50411 Malignant neoplasm of upper-outer quadrant of right female breast: Secondary | ICD-10-CM

## 2018-12-07 DIAGNOSIS — Z170 Estrogen receptor positive status [ER+]: Secondary | ICD-10-CM

## 2018-12-08 ENCOUNTER — Inpatient Hospital Stay (HOSPITAL_BASED_OUTPATIENT_CLINIC_OR_DEPARTMENT_OTHER): Payer: 59 | Admitting: Hematology

## 2018-12-08 ENCOUNTER — Other Ambulatory Visit: Payer: Self-pay

## 2018-12-08 ENCOUNTER — Inpatient Hospital Stay: Payer: 59 | Attending: Hematology

## 2018-12-08 VITALS — BP 103/73 | HR 88 | Temp 98.2°F | Resp 18 | Ht 64.5 in | Wt 151.0 lb

## 2018-12-08 DIAGNOSIS — C50411 Malignant neoplasm of upper-outer quadrant of right female breast: Secondary | ICD-10-CM | POA: Insufficient documentation

## 2018-12-08 DIAGNOSIS — Z923 Personal history of irradiation: Secondary | ICD-10-CM | POA: Insufficient documentation

## 2018-12-08 DIAGNOSIS — C50312 Malignant neoplasm of lower-inner quadrant of left female breast: Secondary | ICD-10-CM

## 2018-12-08 DIAGNOSIS — Z79810 Long term (current) use of selective estrogen receptor modulators (SERMs): Secondary | ICD-10-CM | POA: Insufficient documentation

## 2018-12-08 DIAGNOSIS — Z170 Estrogen receptor positive status [ER+]: Secondary | ICD-10-CM

## 2018-12-08 LAB — CBC WITH DIFFERENTIAL (CANCER CENTER ONLY)
Abs Immature Granulocytes: 0.02 10*3/uL (ref 0.00–0.07)
Basophils Absolute: 0 10*3/uL (ref 0.0–0.1)
Basophils Relative: 0 %
Eosinophils Absolute: 0.1 10*3/uL (ref 0.0–0.5)
Eosinophils Relative: 2 %
HCT: 36 % (ref 36.0–46.0)
Hemoglobin: 12.3 g/dL (ref 12.0–15.0)
Immature Granulocytes: 0 %
Lymphocytes Relative: 31 %
Lymphs Abs: 1.7 10*3/uL (ref 0.7–4.0)
MCH: 32.2 pg (ref 26.0–34.0)
MCHC: 34.2 g/dL (ref 30.0–36.0)
MCV: 94.2 fL (ref 80.0–100.0)
Monocytes Absolute: 0.5 10*3/uL (ref 0.1–1.0)
Monocytes Relative: 9 %
Neutro Abs: 3.2 10*3/uL (ref 1.7–7.7)
Neutrophils Relative %: 58 %
Platelet Count: 203 10*3/uL (ref 150–400)
RBC: 3.82 MIL/uL — ABNORMAL LOW (ref 3.87–5.11)
RDW: 11.9 % (ref 11.5–15.5)
WBC Count: 5.6 10*3/uL (ref 4.0–10.5)
nRBC: 0 % (ref 0.0–0.2)

## 2018-12-08 LAB — CMP (CANCER CENTER ONLY)
ALT: 26 U/L (ref 0–44)
AST: 24 U/L (ref 15–41)
Albumin: 3.9 g/dL (ref 3.5–5.0)
Alkaline Phosphatase: 58 U/L (ref 38–126)
Anion gap: 8 (ref 5–15)
BUN: 17 mg/dL (ref 6–20)
CO2: 27 mmol/L (ref 22–32)
Calcium: 8.4 mg/dL — ABNORMAL LOW (ref 8.9–10.3)
Chloride: 107 mmol/L (ref 98–111)
Creatinine: 1.15 mg/dL — ABNORMAL HIGH (ref 0.44–1.00)
GFR, Est AFR Am: >60 mL/min (ref >60)
GFR, Estimated: 55 mL/min — ABNORMAL LOW (ref >60)
Glucose, Bld: 95 mg/dL (ref 70–99)
Potassium: 3.7 mmol/L (ref 3.5–5.1)
Sodium: 142 mmol/L (ref 135–145)
Total Bilirubin: 0.3 mg/dL (ref 0.3–1.2)
Total Protein: 7 g/dL (ref 6.5–8.1)

## 2018-12-09 ENCOUNTER — Encounter: Payer: Self-pay | Admitting: Hematology

## 2018-12-09 ENCOUNTER — Other Ambulatory Visit: Payer: Self-pay

## 2018-12-09 ENCOUNTER — Telehealth: Payer: Self-pay | Admitting: Hematology

## 2018-12-09 DIAGNOSIS — Z20822 Contact with and (suspected) exposure to covid-19: Secondary | ICD-10-CM

## 2018-12-09 LAB — FOLLICLE STIMULATING HORMONE: FSH: 25 m[IU]/mL

## 2018-12-09 LAB — ESTRADIOL: Estradiol: <5 pg/mL

## 2018-12-09 MED ORDER — TAMOXIFEN CITRATE 20 MG PO TABS: ORAL_TABLET | ORAL | 1 refills | Status: DC

## 2018-12-09 NOTE — Telephone Encounter (Signed)
Scheduled appt per 10/5 los.  Sent a staff message to get a calendar mailed out.

## 2018-12-10 ENCOUNTER — Telehealth: Payer: Self-pay

## 2018-12-10 NOTE — Telephone Encounter (Signed)
Left voice message on patient identified voice mail box letting the patient know that her lab results came back and it showed she is still perimenopausal.   Dr. Burr Medico has refilled your Tamoxifen.  Encouraged her to call back if she has any questions.

## 2018-12-10 NOTE — Telephone Encounter (Signed)
-----   Message from Truitt Merle, MD sent at 12/09/2018  5:29 PM EDT ----- Please let pt know that lab tests showed she is still perimenopausal, so I refilled Tamoxifen, thanks   Truitt Merle  12/09/2018

## 2018-12-11 LAB — NOVEL CORONAVIRUS, NAA: SARS-CoV-2, NAA: NOT DETECTED

## 2018-12-22 ENCOUNTER — Ambulatory Visit
Admission: RE | Admit: 2018-12-22 | Discharge: 2018-12-22 | Disposition: A | Discharge status: Home / Self Care | Payer: 59 | Source: Ambulatory Visit | Attending: Obstetrics and Gynecology | Admitting: Obstetrics and Gynecology

## 2018-12-22 ENCOUNTER — Other Ambulatory Visit: Payer: Self-pay | Admitting: General Surgery

## 2018-12-22 ENCOUNTER — Ambulatory Visit
Admission: RE | Admit: 2018-12-22 | Discharge: 2018-12-22 | Disposition: A | Discharge status: Home / Self Care | Payer: 59 | Source: Ambulatory Visit | Attending: General Surgery | Admitting: General Surgery

## 2018-12-22 ENCOUNTER — Other Ambulatory Visit: Payer: Self-pay

## 2018-12-22 DIAGNOSIS — Z853 Personal history of malignant neoplasm of breast: Secondary | ICD-10-CM

## 2018-12-22 DIAGNOSIS — Z1231 Encounter for screening mammogram for malignant neoplasm of breast: Secondary | ICD-10-CM

## 2018-12-22 HISTORY — DX: Personal history of irradiation: Z92.3

## 2019-02-10 ENCOUNTER — Other Ambulatory Visit: Payer: Self-pay | Admitting: Hematology

## 2019-06-02 ENCOUNTER — Telehealth: Payer: Self-pay | Admitting: Hematology

## 2019-06-02 NOTE — Telephone Encounter (Signed)
Called pt per 3/30 sch message - unable to reach pt . Left message for patient to call back to reschedule.

## 2019-06-08 ENCOUNTER — Ambulatory Visit: Payer: 59 | Admitting: Hematology

## 2019-06-08 ENCOUNTER — Other Ambulatory Visit: Payer: 59

## 2019-06-15 NOTE — Progress Notes (Signed)
Pamela Whitney   [phone] [phone]   Clinic Follow up Note   Patient Care Team: Olga Millers, MD as PCP - General (Obstetrics and Gynecology) Melida Quitter, MD as Consulting Physician (Otolaryngology)  Date of Service:  06/19/2019  CHIEF COMPLAINT: Follow-up of bilateral stage I breast cancer  SUMMARY OF ONCOLOGIC HISTORY: Oncology History Overview Note  Cancer Staging Carcinoma of upper-outer quadrant of right breast in female, estrogen receptor positive ([place]) Staging form: Breast, AJCC 8th Edition - Pathologic stage from 01/18/2017: Stage IA (pT1a, pN0, cM0, G1, ER+, PR+, HER2-) - Signed by Truitt Merle, MD on 06/24/2017 - Clinical: Stage IA (cT1a, cN0(sn), cM0, G1, ER: Positive, PR: Positive, HER2: Negative) - Unsigned  Malignant neoplasm of lower-inner quadrant of left breast in female, estrogen receptor positive ([place]) Staging form: Breast, AJCC 8th Edition - Pathologic stage from 01/18/2017: Stage IA (pT1c, pN0(sn), cM0, G1, ER+, PR+, HER2-, Oncotype DX score: 14) - Signed by Truitt Merle, MD on 06/24/2017     Malignant neoplasm of lower-inner quadrant of left breast in female, estrogen receptor positive ([place])  12/14/2016 Mammogram   Diagnostic mammogram 12/14/16 IMPRESSION: Suspicious 9 x 7 x 9 mm irregular lobular hypoechoic left breast mass at the 630 o'clock 3 cm from the nipple, felt to correspond with mammographic focal architectural distortion. RECOMMENDATION: Ultrasound-guided core needle biopsy left breast mass.   12/18/2016 Initial Biopsy   Diagnosis 12/18/16 Breast, left, needle core biopsy, 6:30 o'clock - INVASIVE DUCTAL CARCINOMA, GRADE I.    12/18/2016 Receptors her2   Estrogen Receptor: 90%, POSITIVE, STRONG STAINING INTENSITY Progesterone Receptor: 90%, POSITIVE, STRONG STAINING INTENSITY Proliferation Marker Ki67: 5% HER2 NEGATIVE    12/20/2016 Initial Diagnosis   Malignant neoplasm of lower-inner quadrant of  left breast in female, estrogen receptor positive ([place])   01/07/2017 Genetic Testing   Patient had genetic testing due to a personal history of breeast cancer and a family history of prostate and colon cancer. The Common Hereditary Cancers Panel + Prostate Cancer Panelwas ordered. The Hereditary Gene Panel offered by [facility] includes sequencing and/or deletion duplication testing of the following 48 genes: APC, ATM, AXIN2, BARD1, BMPR1A, BRCA1, BRCA2, BRIP1, CDH1, CDKN2A (p14ARF), CDKN2A (p16INK4a), CKD4, CHEK2, CTNNA1, DICER1, EPCAM (Deletion/duplication testing only), FANCA, GREM1 (promoter region deletion/duplication testing only), KIT, MEN1, MLH1, MSH2, MSH3, MSH6, MUTYH, NBN, NF1, NHTL1, PALB2, PDGFRA, PMS2, POLD1, POLE, PTEN, RAD50, RAD51C, RAD51D, SDHB, SDHC, SDHD, SMAD4, SMARCA4. STK11, TP53, TSC1, TSC2, and VHL.  The following genes were evaluated for sequence changes only: SDHA and HOXB13 c.251G>A variant only.  Results- Negative, no pathogenic variants identified.  The date of this test report is 01/07/2017   01/18/2017 Surgery   Bilateral Breast lumpectomy with Dr. Donne Hazel    01/18/2017 Pathology Results    Diagnosis 01/18/17 1. Breast, lumpectomy, Left - INVASIVE DUCTAL CARCINOMA, GRADE I/III, SPANNING 1.2 CM. - DUCTAL CARCINOMA IN SITU, LOW GRADE. - THE SURGICAL RESECTION MARGINS ARE NEGATIVE FOR CARCINOMA. - SEE ONCOLOGY TABLE BELOW. 2. Breast, excision, Left additional deep margin - FIBROCYSTIC CHANGES WITH ADENOSIS. - USUAL DUCTAL HYPERPLASIA. - THERE IS NO EVIDENCE OF MALIGNANCY. - SEE COMMENT. 3. Breast, lumpectomy, Right - LOBULAR NEOPLASIA (LOBULAR CARCINOMA IN SITU). - SEE COMMENT. 4. Breast, excision, Right additional Posterior Lateral - INVASIVE LOBULAR CARCINOMA, GRADE I/III, SPANNING, SPANNING 0.4 CM. - THE SURGICAL RESECTION MARGINS ARE NEGATIVE FOR CARCINOMA. - SEE ONCOLOGY TABLE BELOW. 5. Lymph node, sentinel, biopsy, Right Axillary - THERE IS NO EVIDENCE  OF CARCINOMA IN 1  OF 1 LYMPH NODE (0/1). - SEE COMMENT. 6. Lymph node, sentinel, biopsy, Left Axillary - THERE IS NO EVIDENCE OF CARCINOMA IN 1 OF 1 LYMPH NODE (0/1). - SEE COMMENT. 7. Breast, excision, Left additional Inferior Margin - FIBROCYSTIC CHANGES. - THERE IS NO EVIDENCE OF MALIGNANCY. - SEE COMMENT.    01/18/2017 Oncotype testing   Her oncotype recurrence score is 14 and her distant recurrence on Tamoxifen is 10%.   03/12/2017 - 04/08/2017 Radiation Therapy   Radiation therapy with Dr. Lisbeth Renshaw   04/21/2017 -  Anti-estrogen oral therapy   20 mg Tamoxifen daily    12/05/2017 Imaging   Diagnostic bilateral mammogram IMPRESSION: Interval bilateral posttreatment changes without mammographic evidence of malignancy.   12/20/2017 Imaging   Bilateral breast MRI  IMPRESSION: Expected postoperative changes bilaterally. 5 millimeter mass in the upper inner quadrant of the left breast warranting tissue diagnosis.    01/03/2018 Pathology Results   Breast, left, needle core biopsy, UIQ at posterior depth with fibrocystic changes. There is no evidence of malignancy.   Carcinoma of upper-outer quadrant of right breast in female, estrogen receptor positive ([place])  01/18/2017 Cancer Staging   Staging form: Breast, AJCC 8th Edition - Pathologic stage from 01/18/2017: Stage IA (pT1a, pN0, cM0, G1, ER+, PR+, HER2-) - Signed by Truitt Merle, MD on 06/24/2017   02/12/2017 Initial Diagnosis   Carcinoma of upper-outer quadrant of right breast in female, estrogen receptor positive ([place])      CURRENT THERAPY:  Tamoxifen 20 mg daily started on 04/21/17  INTERVAL HISTORY:  Pamela Whitney is here for a follow up of right breast cancer. She was last seen by me 6 months ago. She presents to the clinic alone. She notes she is doing well and stable. She notes she still has hot flashes which is manageable at night. She notes she is still sleeping 3 hours. She does not think her sleep is related to hot flashes  but to Tamoxifen. She tried Gabapentin on '100mg'$  and melatonin but not consistently. She denies joint pain and her appetite is doing well. She notes her weight is stable at 150 pounds but was at 130 pounds before Tamoxifen. She notes she is active every day. She denies any new pain, SOB or chest discomfort. She notes she sees her Gyn in summer and Dr Donne Hazel in January.  She notes her last period was 08/2017.     REVIEW OF SYSTEMS:   Constitutional: Denies fevers, chills or abnormal weight loss (+) Hot flashes, manageable (+) Trouble sleeping Eyes: Denies blurriness of vision Ears, nose, mouth, throat, and face: Denies mucositis or sore throat Respiratory: Denies cough, dyspnea or wheezes Cardiovascular: Denies palpitation, chest discomfort or lower extremity swelling Gastrointestinal:  Denies nausea, heartburn or change in bowel habits Skin: Denies abnormal skin rashes Lymphatics: Denies new lymphadenopathy or easy bruising Neurological:Denies numbness, tingling or new weaknesses Behavioral/Psych: Mood is stable, no new changes  All other systems were reviewed with the patient and are negative.  MEDICAL HISTORY:  Past Medical History:  Diagnosis Date  . Allergy    PCNS  . Breast cancer ([place]) 01/2017   Bilateral  . Family history of colon cancer   . Family history of prostate cancer in father   . Personal history of radiation therapy     SURGICAL HISTORY: Past Surgical History:  Procedure Laterality Date  . BREAST LUMPECTOMY Bilateral 2018  . CESAREAN SECTION    . RADIOACTIVE SEED GUIDED PARTIAL MASTECTOMY WITH AXILLARY SENTINEL LYMPH NODE  BIOPSY Bilateral 01/18/2017   Procedure: BILATERAL BREAST LUMPECTOMY WITH  BILATERAL RADIOACTIVE SEED AND BILATERAL SENTINEL LYMPH NODE BIOPSY;  Surgeon: Rolm Bookbinder, MD;  Location: [facility];  Service: General;  Laterality: Bilateral;  . RHINOPLASTY    . WISDOM TOOTH EXTRACTION      I have reviewed the social history  and family history with the patient and they are unchanged from previous note.  ALLERGIES:  is allergic to penicillins.  MEDICATIONS:  Current Outpatient Medications  Medication Sig Dispense Refill  . gabapentin (NEURONTIN) 100 MG capsule Take 1 capsule (100 mg total) by mouth at bedtime. Increase to two capsules in week 2 and three capsules in week 3 if tolerates well 60 capsule 1  . ibuprofen (ADVIL,MOTRIN) 200 MG tablet Take 200 mg every 6 (six) hours as needed by mouth.    . tamoxifen (NOLVADEX) 20 MG tablet Take 1 tablet (20 mg total) by mouth daily. 90 tablet 1   No current facility-administered medications for this visit.    PHYSICAL EXAMINATION: ECOG PERFORMANCE STATUS: 0 - Asymptomatic  Vitals:   06/19/19 0823  BP: 108/77  Pulse: 81  Resp: 18  Temp: 98 F (36.7 C)  SpO2: 99%   Filed Weights   06/19/19 0823  Weight: 150 lb 6.4 oz (68.2 kg)    GENERAL:alert, no distress and comfortable SKIN: skin color, texture, turgor are normal, no rashes or significant lesions EYES: normal, Conjunctiva are pink and non-injected, sclera clear  NECK: supple, thyroid normal size, non-tender, without nodularity LYMPH:  no palpable lymphadenopathy in the cervical, axillary  LUNGS: clear to auscultation and percussion with normal breathing effort HEART: regular rate & rhythm and no murmurs and no lower extremity edema ABDOMEN:abdomen soft, non-tender and normal bowel sounds Musculoskeletal:no cyanosis of digits and no clubbing  NEURO: alert & oriented x 3 with fluent speech, no focal motor/sensory deficits BREAST: S/p b/l lumpectomies: Surgical incision healed well with mild scar tissue around right nipple incision and left axillary incision. No palpable mass, nodules or adenopathy bilaterally. Breast exam benign.   LABORATORY DATA:  I have reviewed the data as listed CBC Latest Ref Rng & Units 06/19/2019 12/08/2018 09/01/2018  WBC 4.0 - 10.5 K/uL 4.8 5.6 5.1  Hemoglobin 12.0 - 15.0  g/dL 13.1 12.3 12.0  Hematocrit 36.0 - 46.0 % 37.9 36.0 34.9(L)  Platelets 150 - 400 K/uL 197 203 199     CMP Latest Ref Rng & Units 06/19/2019 12/08/2018 09/01/2018  Glucose 70 - 99 mg/dL 121(H) 95 99  BUN 6 - 20 mg/dL '15 17 17  '$ Creatinine 0.44 - 1.00 mg/dL 0.93 1.15(H) 0.91  Sodium 135 - 145 mmol/L 141 142 142  Potassium 3.5 - 5.1 mmol/L 4.2 3.7 4.0  Chloride 98 - 111 mmol/L 105 107 106  CO2 22 - 32 mmol/L '23 27 27  '$ Calcium 8.9 - 10.3 mg/dL 8.8(L) 8.4(L) 8.9  Total Protein 6.5 - 8.1 g/dL 7.3 7.0 7.2  Total Bilirubin 0.3 - 1.2 mg/dL 0.5 0.3 0.3  Alkaline Phos 38 - 126 U/L 55 58 54  AST 15 - 41 U/L 37 24 36  ALT 0 - 44 U/L 55(H) 26 37      RADIOGRAPHIC STUDIES: I have personally reviewed the radiological images as listed and agreed with the findings in the report. No results found.   ASSESSMENT & PLAN:  Pamela Whitney is a 54 y.o. female with   1. 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 She is s/p b/llumpectomyand adjuvant radiation.  -Her Oncotype recurrence score is 14.Due to low risk disease, adjuvant chemotherapy was not recommended. -She started Tamoxifen on 04/21/17 and has been tolerating moderately wellwith hot flashes and insomnia and weight gain.  -Her last period was 08/07/17 before starting Tamoxifen. Her [place] and Estradial levels from 12/2018 showed she is still perimenopausal. Will continue Tamoxifen for now and when she is found to be post-menopausal I will again discuss switching her to AI.  -She is clinically doing well. Lab reviewed, her CBC and CMP are within normal limits. Her physical exam and her 12/2018 mammogram were unremarkable. There is no clinical concern for  recurrence. -Continue surveillance. Next mammogram in 12/2019 -Continue Tamoxifen. Today's [place] level is still pending. If her lab shows she is postmenopausal, I will change her to letrozole to take for 5 years.  -F/u in 02/2020. She will see Dr Donne Hazel in the Summer.    2. Genetic Testingwas negative   3. Insomnia from Hot flashes, Weight gain -She attributes her hot flashes, insomnia, and weight gain to Tamoxifen.  -She notes her hot flashes are manageable.  -Her weight gain has remained stable at 150 pounds. She remains active daily and eats adequately.  -She still gets about 3 hours of sleep nightly, but able to function overall during the day. She has tried Gabapentin and Melatonin but not consistently for results. I also suggested Benadryl.    PLAN: -She is clinically doing well.  -Continue Tamoxifen, refilled today.  -Mammogram in 12/2019 -Lab and f/u in12/2021    No problem-specific Assessment & Plan notes found for this encounter.   Orders Placed This Encounter  Procedures  . MM DIAG BREAST TOMO BILATERAL    Standing Status:   Future    Standing Expiration Date:   06/18/2020    Order Specific Question:   Reason for Exam (SYMPTOM  OR DIAGNOSIS REQUIRED)    Answer:   screening    Order Specific Question:   Is the patient pregnant?    Answer:   Yes    Order Specific Question:   Preferred imaging location?    Answer:   [facility]   All questions were answered. The patient knows to call the clinic with any problems, questions or concerns. No barriers to learning was detected. The total time spent in the appointment was 25 minutes.     Truitt Merle, MD 06/19/2019   I, Joslyn Devon, am acting as scribe for Truitt Merle, MD.   I have reviewed the above documentation for accuracy and completeness, and I agree with the above.

## 2019-06-19 ENCOUNTER — Telehealth: Payer: Self-pay | Admitting: Hematology

## 2019-06-19 ENCOUNTER — Other Ambulatory Visit: Payer: Self-pay

## 2019-06-19 ENCOUNTER — Inpatient Hospital Stay: Payer: 59 | Attending: Hematology

## 2019-06-19 ENCOUNTER — Inpatient Hospital Stay (HOSPITAL_BASED_OUTPATIENT_CLINIC_OR_DEPARTMENT_OTHER): Payer: 59 | Admitting: Hematology

## 2019-06-19 ENCOUNTER — Encounter: Payer: Self-pay | Admitting: Hematology

## 2019-06-19 VITALS — BP 108/77 | HR 81 | Temp 98.0°F | Resp 18 | Ht 64.5 in | Wt 150.4 lb

## 2019-06-19 DIAGNOSIS — C50411 Malignant neoplasm of upper-outer quadrant of right female breast: Secondary | ICD-10-CM | POA: Diagnosis not present

## 2019-06-19 DIAGNOSIS — Z79810 Long term (current) use of selective estrogen receptor modulators (SERMs): Secondary | ICD-10-CM | POA: Insufficient documentation

## 2019-06-19 DIAGNOSIS — Z923 Personal history of irradiation: Secondary | ICD-10-CM | POA: Diagnosis not present

## 2019-06-19 DIAGNOSIS — N951 Menopausal and female climacteric states: Secondary | ICD-10-CM | POA: Insufficient documentation

## 2019-06-19 DIAGNOSIS — Z791 Long term (current) use of non-steroidal anti-inflammatories (NSAID): Secondary | ICD-10-CM | POA: Insufficient documentation

## 2019-06-19 DIAGNOSIS — C50312 Malignant neoplasm of lower-inner quadrant of left female breast: Secondary | ICD-10-CM

## 2019-06-19 DIAGNOSIS — G4700 Insomnia, unspecified: Secondary | ICD-10-CM | POA: Insufficient documentation

## 2019-06-19 DIAGNOSIS — Z170 Estrogen receptor positive status [ER+]: Secondary | ICD-10-CM | POA: Insufficient documentation

## 2019-06-19 DIAGNOSIS — Z800 Family history of malignant neoplasm of digestive organs: Secondary | ICD-10-CM | POA: Insufficient documentation

## 2019-06-19 DIAGNOSIS — Z79899 Other long term (current) drug therapy: Secondary | ICD-10-CM | POA: Insufficient documentation

## 2019-06-19 DIAGNOSIS — R232 Flushing: Secondary | ICD-10-CM | POA: Insufficient documentation

## 2019-06-19 LAB — CMP (CANCER CENTER ONLY)
ALT: 55 U/L — ABNORMAL HIGH (ref 0–44)
AST: 37 U/L (ref 15–41)
Albumin: 3.6 g/dL (ref 3.5–5.0)
Alkaline Phosphatase: 55 U/L (ref 38–126)
Anion gap: 13 (ref 5–15)
BUN: 15 mg/dL (ref 6–20)
CO2: 23 mmol/L (ref 22–32)
Calcium: 8.8 mg/dL — ABNORMAL LOW (ref 8.9–10.3)
Chloride: 105 mmol/L (ref 98–111)
Creatinine: 0.93 mg/dL (ref 0.44–1.00)
GFR, Est AFR Am: >60 mL/min (ref >60)
GFR, Estimated: >60 mL/min (ref >60)
Glucose, Bld: 121 mg/dL — ABNORMAL HIGH (ref 70–99)
Potassium: 4.2 mmol/L (ref 3.5–5.1)
Sodium: 141 mmol/L (ref 135–145)
Total Bilirubin: 0.5 mg/dL (ref 0.3–1.2)
Total Protein: 7.3 g/dL (ref 6.5–8.1)

## 2019-06-19 LAB — CBC WITH DIFFERENTIAL (CANCER CENTER ONLY)
Abs Immature Granulocytes: 0.01 10*3/uL (ref 0.00–0.07)
Basophils Absolute: 0 10*3/uL (ref 0.0–0.1)
Basophils Relative: 0 %
Eosinophils Absolute: 0.1 10*3/uL (ref 0.0–0.5)
Eosinophils Relative: 2 %
HCT: 37.9 % (ref 36.0–46.0)
Hemoglobin: 13.1 g/dL (ref 12.0–15.0)
Immature Granulocytes: 0 %
Lymphocytes Relative: 38 %
Lymphs Abs: 1.8 10*3/uL (ref 0.7–4.0)
MCH: 32.1 pg (ref 26.0–34.0)
MCHC: 34.6 g/dL (ref 30.0–36.0)
MCV: 92.9 fL (ref 80.0–100.0)
Monocytes Absolute: 0.4 10*3/uL (ref 0.1–1.0)
Monocytes Relative: 8 %
Neutro Abs: 2.5 10*3/uL (ref 1.7–7.7)
Neutrophils Relative %: 52 %
Platelet Count: 197 10*3/uL (ref 150–400)
RBC: 4.08 MIL/uL (ref 3.87–5.11)
RDW: 12.2 % (ref 11.5–15.5)
WBC Count: 4.8 10*3/uL (ref 4.0–10.5)
nRBC: 0 % (ref 0.0–0.2)

## 2019-06-19 MED ORDER — TAMOXIFEN CITRATE 20 MG PO TABS: 20.0000 mg | ORAL_TABLET | Freq: Every day | ORAL | 1 refills | Status: DC

## 2019-06-19 NOTE — Telephone Encounter (Signed)
Scheduled appt per 4/16 los.  Left a vm of the appt date and time. 

## 2019-06-20 LAB — FOLLICLE STIMULATING HORMONE: FSH: 22.3 m[IU]/mL

## 2019-06-22 ENCOUNTER — Encounter: Payer: Self-pay | Admitting: Hematology

## 2019-12-23 ENCOUNTER — Other Ambulatory Visit: Payer: Self-pay

## 2019-12-23 ENCOUNTER — Ambulatory Visit
Admission: RE | Admit: 2019-12-23 | Discharge: 2019-12-23 | Disposition: A | Discharge status: Home / Self Care | Payer: 59 | Source: Ambulatory Visit | Attending: Hematology | Admitting: Hematology

## 2019-12-23 DIAGNOSIS — C50312 Malignant neoplasm of lower-inner quadrant of left female breast: Secondary | ICD-10-CM

## 2019-12-23 DIAGNOSIS — C50411 Malignant neoplasm of upper-outer quadrant of right female breast: Secondary | ICD-10-CM

## 2020-02-17 ENCOUNTER — Inpatient Hospital Stay: Payer: 59 | Admitting: Hematology

## 2020-02-17 ENCOUNTER — Inpatient Hospital Stay: Payer: 59

## 2020-02-29 ENCOUNTER — Other Ambulatory Visit: Payer: Self-pay | Admitting: Hematology

## 2020-03-09 ENCOUNTER — Telehealth: Payer: Self-pay | Admitting: Hematology

## 2020-03-09 NOTE — Telephone Encounter (Signed)
Called pt per 1/5 sch msg - no answer. Left message for pt to call back

## 2020-03-14 NOTE — Progress Notes (Signed)
[place]   [phone] [phone]   Clinic Follow up Note   Patient Care Team: Olga Millers, MD as PCP - General (Obstetrics and Gynecology) Melida Quitter, MD as Consulting Physician (Otolaryngology) Truitt Merle, MD as Consulting Physician (Hematology) Rolm Bookbinder, MD as Consulting Physician (General Surgery)  Date of Service:  03/15/2020  CHIEF COMPLAINT: Follow-up of bilateral stage I breast cancer  SUMMARY OF ONCOLOGIC HISTORY: Oncology History Overview Note  Cancer Staging Carcinoma of upper-outer quadrant of right breast in female, estrogen receptor positive ([place]) Staging form: Breast, AJCC 8th Edition - Pathologic stage from 01/18/2017: Stage IA (pT1a, pN0, cM0, G1, ER+, PR+, HER2-) - Signed by Truitt Merle, MD on 06/24/2017 - Clinical: Stage IA (cT1a, cN0(sn), cM0, G1, ER: Positive, PR: Positive, HER2: Negative) - Unsigned  Malignant neoplasm of lower-inner quadrant of left breast in female, estrogen receptor positive ([place]) Staging form: Breast, AJCC 8th Edition - Pathologic stage from 01/18/2017: Stage IA (pT1c, pN0(sn), cM0, G1, ER+, PR+, HER2-, Oncotype DX score: 14) - Signed by Truitt Merle, MD on 06/24/2017     Malignant neoplasm of lower-inner quadrant of left breast in female, estrogen receptor positive ([place])  12/14/2016 Mammogram   Diagnostic mammogram 12/14/16 IMPRESSION: Suspicious 9 x 7 x 9 mm irregular lobular hypoechoic left breast mass at the 630 o'clock 3 cm from the nipple, felt to correspond with mammographic focal architectural distortion. RECOMMENDATION: Ultrasound-guided core needle biopsy left breast mass.   12/18/2016 Initial Biopsy   Diagnosis 12/18/16 Breast, left, needle core biopsy, 6:30 o'clock - INVASIVE DUCTAL CARCINOMA, GRADE I.    12/18/2016 Receptors her2   Estrogen Receptor: 90%, POSITIVE, STRONG STAINING INTENSITY Progesterone Receptor: 90%, POSITIVE, STRONG STAINING  INTENSITY Proliferation Marker Ki67: 5% HER2 NEGATIVE    12/20/2016 Initial Diagnosis   Malignant neoplasm of lower-inner quadrant of left breast in female, estrogen receptor positive ([place])   01/07/2017 Genetic Testing   Patient had genetic testing due to a personal history of breeast cancer and a family history of prostate and colon cancer. The Common Hereditary Cancers Panel + Prostate Cancer Panelwas ordered. The Hereditary Gene Panel offered by [facility] includes sequencing and/or deletion duplication testing of the following 48 genes: APC, ATM, AXIN2, BARD1, BMPR1A, BRCA1, BRCA2, BRIP1, CDH1, CDKN2A (p14ARF), CDKN2A (p16INK4a), CKD4, CHEK2, CTNNA1, DICER1, EPCAM (Deletion/duplication testing only), FANCA, GREM1 (promoter region deletion/duplication testing only), KIT, MEN1, MLH1, MSH2, MSH3, MSH6, MUTYH, NBN, NF1, NHTL1, PALB2, PDGFRA, PMS2, POLD1, POLE, PTEN, RAD50, RAD51C, RAD51D, SDHB, SDHC, SDHD, SMAD4, SMARCA4. STK11, TP53, TSC1, TSC2, and VHL.  The following genes were evaluated for sequence changes only: SDHA and HOXB13 c.251G>A variant only.  Results- Negative, no pathogenic variants identified.  The date of this test report is 01/07/2017   01/18/2017 Surgery   Bilateral Breast lumpectomy with Dr. Donne Hazel    01/18/2017 Pathology Results    Diagnosis 01/18/17 1. Breast, lumpectomy, Left - INVASIVE DUCTAL CARCINOMA, GRADE I/III, SPANNING 1.2 CM. - DUCTAL CARCINOMA IN SITU, LOW GRADE. - THE SURGICAL RESECTION MARGINS ARE NEGATIVE FOR CARCINOMA. - SEE ONCOLOGY TABLE BELOW. 2. Breast, excision, Left additional deep margin - FIBROCYSTIC CHANGES WITH ADENOSIS. - USUAL DUCTAL HYPERPLASIA. - THERE IS NO EVIDENCE OF MALIGNANCY. - SEE COMMENT. 3. Breast, lumpectomy, Right - LOBULAR NEOPLASIA (LOBULAR CARCINOMA IN SITU). - SEE COMMENT. 4. Breast, excision, Right additional Posterior Lateral - INVASIVE LOBULAR CARCINOMA, GRADE I/III, SPANNING, SPANNING 0.4 CM. - THE SURGICAL  RESECTION MARGINS ARE NEGATIVE FOR CARCINOMA. - SEE ONCOLOGY TABLE BELOW. 5.  Lymph node, sentinel, biopsy, Right Axillary - THERE IS NO EVIDENCE OF CARCINOMA IN 1 OF 1 LYMPH NODE (0/1). - SEE COMMENT. 6. Lymph node, sentinel, biopsy, Left Axillary - THERE IS NO EVIDENCE OF CARCINOMA IN 1 OF 1 LYMPH NODE (0/1). - SEE COMMENT. 7. Breast, excision, Left additional Inferior Margin - FIBROCYSTIC CHANGES. - THERE IS NO EVIDENCE OF MALIGNANCY. - SEE COMMENT.    01/18/2017 Oncotype testing   Her oncotype recurrence score is 14 and her distant recurrence on Tamoxifen is 10%.   03/12/2017 - 04/08/2017 Radiation Therapy   Radiation therapy with Dr. Lisbeth Renshaw   04/21/2017 -  Anti-estrogen oral therapy   20 mg Tamoxifen daily    12/05/2017 Imaging   Diagnostic bilateral mammogram IMPRESSION: Interval bilateral posttreatment changes without mammographic evidence of malignancy.   12/20/2017 Imaging   Bilateral breast MRI  IMPRESSION: Expected postoperative changes bilaterally. 5 millimeter mass in the upper inner quadrant of the left breast warranting tissue diagnosis.    01/03/2018 Pathology Results   Breast, left, needle core biopsy, UIQ at posterior depth with fibrocystic changes. There is no evidence of malignancy.   Carcinoma of upper-outer quadrant of right breast in female, estrogen receptor positive ([place])  01/18/2017 Cancer Staging   Staging form: Breast, AJCC 8th Edition - Pathologic stage from 01/18/2017: Stage IA (pT1a, pN0, cM0, G1, ER+, PR+, HER2-) - Signed by Truitt Merle, MD on 06/24/2017   02/12/2017 Initial Diagnosis   Carcinoma of upper-outer quadrant of right breast in female, estrogen receptor positive ([place])      CURRENT THERAPY:  Tamoxifen 20 mg daily started on 04/21/17  INTERVAL HISTORY:  Pamela Whitney is here for a follow up of right breast cancer. She was last seen by me 9 months ago. She presents to the clinic alone. She is doing well. She notes she is on Tamoxifen and  able to tolerate well. She notes she is no longer gaining weight on Tamoxifen but not able to lose weight either. She has tried low carb and walking daily. She is interested in losing 20 pounds. She notes hot flashes are tolerable. She notes her occasional bone aches in her pain and is not sure if this is related to Tamoxifen. She notes her last period was June 2019.   She has had her COVID vaccinations     REVIEW OF SYSTEMS:   Constitutional: Denies fevers, chills or abnormal weight loss (+) hot flashes  Eyes: Denies blurriness of vision Ears, nose, mouth, throat, and face: Denies mucositis or sore throat Respiratory: Denies cough, dyspnea or wheezes Cardiovascular: Denies palpitation, chest discomfort or lower extremity swelling Gastrointestinal:  Denies nausea, heartburn or change in bowel habits Skin: Denies abnormal skin rashes MSK: (+) Occasional finger pain  Lymphatics: Denies new lymphadenopathy or easy bruising Neurological:Denies numbness, tingling or new weaknesses Behavioral/Psych: Mood is stable, no new changes  All other systems were reviewed with the patient and are negative.  MEDICAL HISTORY:  Past Medical History:  Diagnosis Date  . Allergy    PCNS  . Breast cancer ([place]) 01/2017   Bilateral  . Family history of colon cancer   . Family history of prostate cancer in father   . Personal history of radiation therapy     SURGICAL HISTORY: Past Surgical History:  Procedure Laterality Date  . BREAST LUMPECTOMY Bilateral 2018  . CESAREAN SECTION    . RADIOACTIVE SEED GUIDED PARTIAL MASTECTOMY WITH AXILLARY SENTINEL LYMPH NODE BIOPSY Bilateral 01/18/2017   Procedure: BILATERAL BREAST LUMPECTOMY  WITH  BILATERAL RADIOACTIVE SEED AND BILATERAL SENTINEL LYMPH NODE BIOPSY;  Surgeon: Rolm Bookbinder, MD;  Location: [facility];  Service: General;  Laterality: Bilateral;  . RHINOPLASTY    . WISDOM TOOTH EXTRACTION      I have reviewed the social history  and family history with the patient and they are unchanged from previous note.  ALLERGIES:  is allergic to penicillins.  MEDICATIONS:  Current Outpatient Medications  Medication Sig Dispense Refill  . gabapentin (NEURONTIN) 100 MG capsule Take 1 capsule (100 mg total) by mouth at bedtime. Increase to two capsules in week 2 and three capsules in week 3 if tolerates well 60 capsule 1  . ibuprofen (ADVIL,MOTRIN) 200 MG tablet Take 200 mg every 6 (six) hours as needed by mouth.    . tamoxifen (NOLVADEX) 20 MG tablet Take 1 tablet (20 mg total) by mouth daily. 90 tablet 1   No current facility-administered medications for this visit.    PHYSICAL EXAMINATION: ECOG PERFORMANCE STATUS: 0 - Asymptomatic  Vitals:   03/15/20 0854  BP: 117/83  Pulse: 79  Resp: 18  Temp: 97.6 F (36.4 C)  SpO2: 100%   Filed Weights   03/15/20 0854  Weight: 147 lb 14.4 oz (67.1 kg)    GENERAL:alert, no distress and comfortable SKIN: skin color, texture, turgor are normal, no rashes or significant lesions EYES: normal, Conjunctiva are pink and non-injected, sclera clear  NECK: supple, thyroid normal size, non-tender, without nodularity LYMPH:  no palpable lymphadenopathy in the cervical, axillary  LUNGS: clear to auscultation and percussion with normal breathing effort HEART: regular rate & rhythm and no murmurs and no lower extremity edema ABDOMEN:abdomen soft, non-tender and normal bowel sounds Musculoskeletal:no cyanosis of digits and no clubbing  NEURO: alert & oriented x 3 with fluent speech, no focal motor/sensory deficits BREAST: S/p B/l lumpectomy: Surgical incisions healed well with minimal scar tissue (+) Mild left breast lymphedema  LABORATORY DATA:  I have reviewed the data as listed CBC Latest Ref Rng & Units 03/15/2020 06/19/2019 12/08/2018  WBC 4.0 - 10.5 K/uL 4.4 4.8 5.6  Hemoglobin 12.0 - 15.0 g/dL 12.9 13.1 12.3  Hematocrit 36.0 - 46.0 % 37.2 37.9 36.0  Platelets 150 - 400 K/uL 195  197 203     CMP Latest Ref Rng & Units 06/19/2019 12/08/2018 09/01/2018  Glucose 70 - 99 mg/dL 121(H) 95 99  BUN 6 - 20 mg/dL _0 Creatinine 0.44 - 1.00 mg/dL 0.93 1.15(H) 0.91  Sodium 135 - 145 mmol/L 141 142 142  Potassium 3.5 - 5.1 mmol/L 4.2 3.7 4.0  Chloride 98 - 111 mmol/L 105 107 106  CO2 22 - 32 mmol/L _1 Calcium 8.9 - 10.3 mg/dL 8.8(L) 8.4(L) 8.9  Total Protein 6.5 - 8.1 g/dL 7.3 7.0 7.2  Total Bilirubin 0.3 - 1.2 mg/dL 0.5 0.3 0.3  Alkaline Phos 38 - 126 U/L 55 58 54  AST 15 - 41 U/L 37 24 36  ALT 0 - 44 U/L 55(H) 26 37      RADIOGRAPHIC STUDIES: I have personally reviewed the radiological images as listed and agreed with the findings in the report. No results found.   ASSESSMENT & PLAN:  HASSET CHAVIANO is a 55 y.o. female with   1. 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 She is s/p b/llumpectomyand adjuvant radiation.  -Her Oncotype recurrence score is 14.Due to low risk disease, adjuvant chemotherapy was not recommended. -She started Tamoxifen on 04/21/17 and has been tolerating moderately wellwith hot flashes and insomnia and weight gain.  -Her last period was 08/07/17 before starting Tamoxifen. Her Silt and Estradial levels from  06/2019 showed she was still perimenopausal. Will continue Tamoxifen for now and when she is found to be post-menopausal I will again discuss switching her to AI.  -She is clinically doing well. Lab reviewed, her CBC and CMP are within normal limits. Her physical exam and her 12/2019 mammogram were unremarkable. There is no clinical concern for recurrence. -Continue Surveillance. Next mammogram in 12/2020 -Continue Tamoxifen.  -F/u in 6 months    2.  Genetic Testingwas negative   3. Insomnia from Hot flashes, Weight gain -She attributes her hot flashes, insomnia, and weight gain to Tamoxifen.  -She notes her hot flashes are manageable. She declined any medications to help.  -Her weight gain has remained stable at 150 pounds. She remains active daily and eats adequately.  -Since initially gaining weight on Tamoxifen to 150 pounds, she struggles to lose this weight. Weight has been stable. I reviewed weight management with daily exercise and low carb diet, which she is doing.  -I also discussed option of [facility]. She will consider it.    PLAN: -She is clinically doing well.  -Continue Tamoxifen, refilled today.  -Lab and f/u in 6 months, will space her f/u with her GYN and PCP in future    No problem-specific Assessment & Plan notes found for this encounter.   Orders Placed This Encounter  Procedures  . FSH-Follicle stimulating hormone    Standing Status:   Future    Standing Expiration Date:   03/15/2021   All questions were answered. The patient knows to call the clinic with any problems, questions or concerns. No barriers to learning was detected. The total time spent in the appointment was 25 minutes.     Truitt Merle, MD 03/15/2020   I, Joslyn Devon, am acting as scribe for Truitt Merle, MD.   I have reviewed the above documentation for accuracy and completeness, and I agree with the above.

## 2020-03-15 ENCOUNTER — Inpatient Hospital Stay: Payer: 59 | Admitting: Hematology

## 2020-03-15 ENCOUNTER — Other Ambulatory Visit: Payer: Self-pay

## 2020-03-15 ENCOUNTER — Inpatient Hospital Stay: Payer: 59 | Attending: Hematology

## 2020-03-15 ENCOUNTER — Encounter: Payer: Self-pay | Admitting: Hematology

## 2020-03-15 VITALS — BP 117/83 | HR 79 | Temp 97.6°F | Resp 18 | Ht 64.5 in | Wt 147.9 lb

## 2020-03-15 DIAGNOSIS — Z79810 Long term (current) use of selective estrogen receptor modulators (SERMs): Secondary | ICD-10-CM | POA: Diagnosis not present

## 2020-03-15 DIAGNOSIS — N951 Menopausal and female climacteric states: Secondary | ICD-10-CM | POA: Diagnosis not present

## 2020-03-15 DIAGNOSIS — Z79899 Other long term (current) drug therapy: Secondary | ICD-10-CM | POA: Insufficient documentation

## 2020-03-15 DIAGNOSIS — C50312 Malignant neoplasm of lower-inner quadrant of left female breast: Secondary | ICD-10-CM | POA: Diagnosis not present

## 2020-03-15 DIAGNOSIS — Z923 Personal history of irradiation: Secondary | ICD-10-CM | POA: Insufficient documentation

## 2020-03-15 DIAGNOSIS — C50411 Malignant neoplasm of upper-outer quadrant of right female breast: Secondary | ICD-10-CM | POA: Diagnosis not present

## 2020-03-15 DIAGNOSIS — Z170 Estrogen receptor positive status [ER+]: Secondary | ICD-10-CM

## 2020-03-15 LAB — CMP (CANCER CENTER ONLY)
ALT: 27 U/L (ref 0–44)
AST: 24 U/L (ref 15–41)
Albumin: 3.8 g/dL (ref 3.5–5.0)
Alkaline Phosphatase: 54 U/L (ref 38–126)
Anion gap: 9 (ref 5–15)
BUN: 18 mg/dL (ref 6–20)
CO2: 25 mmol/L (ref 22–32)
Calcium: 9.3 mg/dL (ref 8.9–10.3)
Chloride: 107 mmol/L (ref 98–111)
Creatinine: 1.07 mg/dL — ABNORMAL HIGH (ref 0.44–1.00)
GFR, Estimated: >60 mL/min (ref >60)
Glucose, Bld: 107 mg/dL — ABNORMAL HIGH (ref 70–99)
Potassium: 3.9 mmol/L (ref 3.5–5.1)
Sodium: 141 mmol/L (ref 135–145)
Total Bilirubin: 0.5 mg/dL (ref 0.3–1.2)
Total Protein: 7.3 g/dL (ref 6.5–8.1)

## 2020-03-15 LAB — CBC WITH DIFFERENTIAL (CANCER CENTER ONLY)
Abs Immature Granulocytes: 0.01 10*3/uL (ref 0.00–0.07)
Basophils Absolute: 0 10*3/uL (ref 0.0–0.1)
Basophils Relative: 1 %
Eosinophils Absolute: 0.1 10*3/uL (ref 0.0–0.5)
Eosinophils Relative: 2 %
HCT: 37.2 % (ref 36.0–46.0)
Hemoglobin: 12.9 g/dL (ref 12.0–15.0)
Immature Granulocytes: 0 %
Lymphocytes Relative: 42 %
Lymphs Abs: 1.8 10*3/uL (ref 0.7–4.0)
MCH: 32.7 pg (ref 26.0–34.0)
MCHC: 34.7 g/dL (ref 30.0–36.0)
MCV: 94.2 fL (ref 80.0–100.0)
Monocytes Absolute: 0.3 10*3/uL (ref 0.1–1.0)
Monocytes Relative: 7 %
Neutro Abs: 2.1 10*3/uL (ref 1.7–7.7)
Neutrophils Relative %: 48 %
Platelet Count: 195 10*3/uL (ref 150–400)
RBC: 3.95 MIL/uL (ref 3.87–5.11)
RDW: 11.8 % (ref 11.5–15.5)
WBC Count: 4.4 10*3/uL (ref 4.0–10.5)
nRBC: 0 % (ref 0.0–0.2)

## 2020-03-15 MED ORDER — TAMOXIFEN CITRATE 20 MG PO TABS: 20.0000 mg | ORAL_TABLET | Freq: Every day | ORAL | 1 refills | Status: DC

## 2020-03-16 ENCOUNTER — Telehealth: Payer: Self-pay | Admitting: Hematology

## 2020-03-16 NOTE — Telephone Encounter (Signed)
Scheduled appts per 1/11 los. Unable to leave voicemail. Will mail appt reminder and calendar per 1/18 inbasket msg to self.

## 2020-09-03 ENCOUNTER — Other Ambulatory Visit: Payer: Self-pay | Admitting: Hematology

## 2020-09-15 ENCOUNTER — Inpatient Hospital Stay: Payer: 59 | Admitting: Hematology

## 2020-09-15 ENCOUNTER — Inpatient Hospital Stay: Payer: 59

## 2020-10-06 ENCOUNTER — Encounter: Payer: Self-pay | Admitting: Hematology

## 2020-10-06 ENCOUNTER — Inpatient Hospital Stay: Payer: 59 | Attending: Hematology

## 2020-10-06 ENCOUNTER — Other Ambulatory Visit: Payer: Self-pay

## 2020-10-06 ENCOUNTER — Inpatient Hospital Stay: Payer: 59 | Admitting: Hematology

## 2020-10-06 VITALS — BP 124/83 | HR 76 | Temp 98.2°F | Resp 18 | Ht 64.5 in | Wt 147.6 lb

## 2020-10-06 DIAGNOSIS — C50411 Malignant neoplasm of upper-outer quadrant of right female breast: Secondary | ICD-10-CM | POA: Diagnosis present

## 2020-10-06 DIAGNOSIS — G4700 Insomnia, unspecified: Secondary | ICD-10-CM | POA: Diagnosis not present

## 2020-10-06 DIAGNOSIS — Z170 Estrogen receptor positive status [ER+]: Secondary | ICD-10-CM

## 2020-10-06 DIAGNOSIS — Z79810 Long term (current) use of selective estrogen receptor modulators (SERMs): Secondary | ICD-10-CM | POA: Insufficient documentation

## 2020-10-06 DIAGNOSIS — C50312 Malignant neoplasm of lower-inner quadrant of left female breast: Secondary | ICD-10-CM | POA: Insufficient documentation

## 2020-10-06 DIAGNOSIS — Z923 Personal history of irradiation: Secondary | ICD-10-CM | POA: Insufficient documentation

## 2020-10-06 DIAGNOSIS — N951 Menopausal and female climacteric states: Secondary | ICD-10-CM | POA: Diagnosis not present

## 2020-10-06 LAB — CBC WITH DIFFERENTIAL (CANCER CENTER ONLY)
Abs Immature Granulocytes: 0.02 10*3/uL (ref 0.00–0.07)
Basophils Absolute: 0 10*3/uL (ref 0.0–0.1)
Basophils Relative: 1 %
Eosinophils Absolute: 0.1 10*3/uL (ref 0.0–0.5)
Eosinophils Relative: 2 %
HCT: 36.8 % (ref 36.0–46.0)
Hemoglobin: 12.8 g/dL (ref 12.0–15.0)
Immature Granulocytes: 1 %
Lymphocytes Relative: 41 %
Lymphs Abs: 1.8 10*3/uL (ref 0.7–4.0)
MCH: 32.8 pg (ref 26.0–34.0)
MCHC: 34.8 g/dL (ref 30.0–36.0)
MCV: 94.4 fL (ref 80.0–100.0)
Monocytes Absolute: 0.3 10*3/uL (ref 0.1–1.0)
Monocytes Relative: 7 %
Neutro Abs: 2.2 10*3/uL (ref 1.7–7.7)
Neutrophils Relative %: 48 %
Platelet Count: 202 10*3/uL (ref 150–400)
RBC: 3.9 MIL/uL (ref 3.87–5.11)
RDW: 12 % (ref 11.5–15.5)
WBC Count: 4.4 10*3/uL (ref 4.0–10.5)
nRBC: 0 % (ref 0.0–0.2)

## 2020-10-06 LAB — CMP (CANCER CENTER ONLY)
ALT: 27 U/L (ref 0–44)
AST: 24 U/L (ref 15–41)
Albumin: 3.9 g/dL (ref 3.5–5.0)
Alkaline Phosphatase: 51 U/L (ref 38–126)
Anion gap: 9 (ref 5–15)
BUN: 17 mg/dL (ref 6–20)
CO2: 25 mmol/L (ref 22–32)
Calcium: 9.5 mg/dL (ref 8.9–10.3)
Chloride: 107 mmol/L (ref 98–111)
Creatinine: 0.94 mg/dL (ref 0.44–1.00)
GFR, Estimated: >60 mL/min (ref >60)
Glucose, Bld: 99 mg/dL (ref 70–99)
Potassium: 4.5 mmol/L (ref 3.5–5.1)
Sodium: 141 mmol/L (ref 135–145)
Total Bilirubin: 0.5 mg/dL (ref 0.3–1.2)
Total Protein: 7.3 g/dL (ref 6.5–8.1)

## 2020-10-06 NOTE — Progress Notes (Signed)
[place]   [phone] Fax:(336) [phone]   Clinic Follow up Note   Patient Care Team: Olga Millers, MD as PCP - General (Obstetrics and Gynecology) Melida Quitter, MD as Consulting Physician (Otolaryngology) Truitt Merle, MD as Consulting Physician (Hematology) Rolm Bookbinder, MD as Consulting Physician (General Surgery)  Date of Service:  10/06/2020  CHIEF COMPLAINT: f/u of bilateral breast cancers  SUMMARY OF ONCOLOGIC HISTORY: Oncology History Overview Note  Cancer Staging Carcinoma of upper-outer quadrant of right breast in female, estrogen receptor positive ([place]) Staging form: Breast, AJCC 8th Edition - Pathologic stage from 01/18/2017: Stage IA (pT1a, pN0, cM0, G1, ER+, PR+, HER2-) - Signed by Truitt Merle, MD on 06/24/2017 - Clinical: Stage IA (cT1a, cN0(sn), cM0, G1, ER: Positive, PR: Positive, HER2: Negative) - Unsigned  Malignant neoplasm of lower-inner quadrant of left breast in female, estrogen receptor positive ([place]) Staging form: Breast, AJCC 8th Edition - Pathologic stage from 01/18/2017: Stage IA (pT1c, pN0(sn), cM0, G1, ER+, PR+, HER2-, Oncotype DX score: 14) - Signed by Truitt Merle, MD on 06/24/2017     Malignant neoplasm of lower-inner quadrant of left breast in female, estrogen receptor positive ([place])  12/14/2016 Mammogram   Diagnostic mammogram 12/14/16 IMPRESSION: Suspicious 9 x 7 x 9 mm irregular lobular hypoechoic left breast mass at the 630 o'clock 3 cm from the nipple, felt to correspond with mammographic focal architectural distortion.  RECOMMENDATION: Ultrasound-guided core needle biopsy left breast mass.    12/18/2016 Initial Biopsy   Diagnosis 12/18/16 Breast, left, needle core biopsy, 6:30 o'clock - INVASIVE DUCTAL CARCINOMA, GRADE I.     12/18/2016 Receptors her2   Estrogen Receptor: 90%, POSITIVE, STRONG STAINING INTENSITY Progesterone Receptor: 90%, POSITIVE, STRONG STAINING INTENSITY Proliferation Marker  Ki67: 5% HER2 NEGATIVE     12/20/2016 Initial Diagnosis   Malignant neoplasm of lower-inner quadrant of left breast in female, estrogen receptor positive ([place])    01/07/2017 Genetic Testing   Patient had genetic testing due to a personal history of breeast cancer and a family history of prostate and colon cancer. The Common Hereditary Cancers Panel + Prostate Cancer Panelwas ordered. The Hereditary Gene Panel offered by [facility] includes sequencing and/or deletion duplication testing of the following 48 genes: APC, ATM, AXIN2, BARD1, BMPR1A, BRCA1, BRCA2, BRIP1, CDH1, CDKN2A (p14ARF), CDKN2A (p16INK4a), CKD4, CHEK2, CTNNA1, DICER1, EPCAM (Deletion/duplication testing only), FANCA, GREM1 (promoter region deletion/duplication testing only), KIT, MEN1, MLH1, MSH2, MSH3, MSH6, MUTYH, NBN, NF1, NHTL1, PALB2, PDGFRA, PMS2, POLD1, POLE, PTEN, RAD50, RAD51C, RAD51D, SDHB, SDHC, SDHD, SMAD4, SMARCA4. STK11, TP53, TSC1, TSC2, and VHL.  The following genes were evaluated for sequence changes only: SDHA and HOXB13 c.251G>A variant only.  Results- Negative, no pathogenic variants identified.  The date of this test report is 01/07/2017    01/18/2017 Surgery   Bilateral Breast lumpectomy with Dr. Donne Hazel    01/18/2017 Pathology Results    Diagnosis 01/18/17 1. Breast, lumpectomy, Left - INVASIVE DUCTAL CARCINOMA, GRADE I/III, SPANNING 1.2 CM. - DUCTAL CARCINOMA IN SITU, LOW GRADE. - THE SURGICAL RESECTION MARGINS ARE NEGATIVE FOR CARCINOMA. - SEE ONCOLOGY TABLE BELOW. 2. Breast, excision, Left additional deep margin - FIBROCYSTIC CHANGES WITH ADENOSIS. - USUAL DUCTAL HYPERPLASIA. - THERE IS NO EVIDENCE OF MALIGNANCY. - SEE COMMENT. 3. Breast, lumpectomy, Right - LOBULAR NEOPLASIA (LOBULAR CARCINOMA IN SITU). - SEE COMMENT. 4. Breast, excision, Right additional Posterior Lateral - INVASIVE LOBULAR CARCINOMA, GRADE I/III, SPANNING, SPANNING 0.4 CM. - THE SURGICAL RESECTION MARGINS ARE NEGATIVE FOR  CARCINOMA. - SEE  ONCOLOGY TABLE BELOW. 5. Lymph node, sentinel, biopsy, Right Axillary - THERE IS NO EVIDENCE OF CARCINOMA IN 1 OF 1 LYMPH NODE (0/1). - SEE COMMENT. 6. Lymph node, sentinel, biopsy, Left Axillary - THERE IS NO EVIDENCE OF CARCINOMA IN 1 OF 1 LYMPH NODE (0/1). - SEE COMMENT. 7. Breast, excision, Left additional Inferior Margin - FIBROCYSTIC CHANGES. - THERE IS NO EVIDENCE OF MALIGNANCY. - SEE COMMENT.     01/18/2017 Oncotype testing   Her oncotype recurrence score is 14 and her distant recurrence on Tamoxifen is 10%.   03/12/2017 - 04/08/2017 Radiation Therapy   Radiation therapy with Dr. Lisbeth Renshaw    04/21/2017 -  Anti-estrogen oral therapy   20 mg Tamoxifen daily     12/05/2017 Imaging   Diagnostic bilateral mammogram IMPRESSION: Interval bilateral posttreatment changes without mammographic evidence of malignancy.    12/20/2017 Imaging   Bilateral breast MRI  IMPRESSION: Expected postoperative changes bilaterally. 5 millimeter mass in the upper inner quadrant of the left breast warranting tissue diagnosis.     01/03/2018 Pathology Results   Breast, left, needle core biopsy, UIQ at posterior depth with fibrocystic changes. There is no evidence of malignancy.    Carcinoma of upper-outer quadrant of right breast in female, estrogen receptor positive ([place])  01/18/2017 Cancer Staging   Staging form: Breast, AJCC 8th Edition - Pathologic stage from 01/18/2017: Stage IA (pT1a, pN0, cM0, G1, ER+, PR+, HER2-) - Signed by Truitt Merle, MD on 06/24/2017    02/12/2017 Initial Diagnosis   Carcinoma of upper-outer quadrant of right breast in female, estrogen receptor positive ([place])       CURRENT THERAPY:  Tamoxifen 20 mg daily started on 04/21/17  INTERVAL HISTORY:  COUNTESS BIEBEL is here for a follow up of breast cancer. She was last seen by me on 03/15/20. She presents to the clinic alone. She reports aching to her right pinky finger. She notes she is still able to move  it and denies injury. She notes she has no explanation for this. She also reports aching in her knees, which she attributes to age.   All other systems were reviewed with the patient and are negative.  MEDICAL HISTORY:  Past Medical History:  Diagnosis Date   Allergy    PCNS   Breast cancer ([place]) 01/2017   Bilateral   Family history of colon cancer    Family history of prostate cancer in father    Personal history of radiation therapy     SURGICAL HISTORY: Past Surgical History:  Procedure Laterality Date   BREAST LUMPECTOMY Bilateral 2018   CESAREAN SECTION     RADIOACTIVE SEED GUIDED PARTIAL MASTECTOMY WITH AXILLARY SENTINEL LYMPH NODE BIOPSY Bilateral 01/18/2017   Procedure: BILATERAL BREAST LUMPECTOMY WITH  BILATERAL RADIOACTIVE SEED AND BILATERAL SENTINEL LYMPH NODE BIOPSY;  Surgeon: Rolm Bookbinder, MD;  Location: [facility];  Service: General;  Laterality: Bilateral;   RHINOPLASTY     WISDOM TOOTH EXTRACTION      I have reviewed the social history and family history with the patient and they are unchanged from previous note.  ALLERGIES:  is allergic to penicillins.  MEDICATIONS:  Current Outpatient Medications  Medication Sig Dispense Refill   ibuprofen (ADVIL,MOTRIN) 200 MG tablet Take 200 mg every 6 (six) hours as needed by mouth.     tamoxifen (NOLVADEX) 20 MG tablet TAKE 1 TABLET BY MOUTH EVERY DAY 30 tablet 5   No current facility-administered medications for this visit.    PHYSICAL  EXAMINATION: ECOG PERFORMANCE STATUS: 0 - Asymptomatic  Vitals:   10/06/20 0917  BP: 124/83  Pulse: 76  Resp: 18  Temp: 98.2 F (36.8 C)  SpO2: 100%   Wt Readings from Last 3 Encounters:  10/06/20 147 lb 9.6 oz (67 kg)  03/15/20 147 lb 14.4 oz (67.1 kg)  06/19/19 150 lb 6.4 oz (68.2 kg)     GENERAL:alert, no distress and comfortable SKIN: skin color, texture, turgor are normal, no rashes or significant lesions EYES: normal, Conjunctiva are pink and  non-injected, sclera clear  NECK: supple, thyroid normal size, non-tender, without nodularity LYMPH:  no palpable lymphadenopathy in the cervical, axillary  LUNGS: clear to auscultation and percussion with normal breathing effort HEART: regular rate & rhythm and no murmurs and no lower extremity edema ABDOMEN:abdomen soft, non-tender and normal bowel sounds Musculoskeletal:no cyanosis of digits and no clubbing  NEURO: alert & oriented x 3 with fluent speech, no focal motor/sensory deficits BREAST: scar tissue present in arm pit area, as expected; No palpable mass, nodules or adenopathy bilaterally. Breast exam benign.   LABORATORY DATA:  I have reviewed the data as listed CBC Latest Ref Rng & Units 10/06/2020 03/15/2020 06/19/2019  WBC 4.0 - 10.5 K/uL 4.4 4.4 4.8  Hemoglobin 12.0 - 15.0 g/dL 12.8 12.9 13.1  Hematocrit 36.0 - 46.0 % 36.8 37.2 37.9  Platelets 150 - 400 K/uL 202 195 197     CMP Latest Ref Rng & Units 10/06/2020 03/15/2020 06/19/2019  Glucose 70 - 99 mg/dL 99 107(H) 121(H)  BUN 6 - 20 mg/dL $Remove'17 18 15  'hhrXEcE$ Creatinine 0.44 - 1.00 mg/dL 0.94 1.07(H) 0.93  Sodium 135 - 145 mmol/L 141 141 141  Potassium 3.5 - 5.1 mmol/L 4.5 3.9 4.2  Chloride 98 - 111 mmol/L 107 107 105  CO2 22 - 32 mmol/L $RemoveB'25 25 23  'zUJJvWgS$ Calcium 8.9 - 10.3 mg/dL 9.5 9.3 8.8(L)  Total Protein 6.5 - 8.1 g/dL 7.3 7.3 7.3  Total Bilirubin 0.3 - 1.2 mg/dL 0.5 0.5 0.5  Alkaline Phos 38 - 126 U/L 51 54 55  AST 15 - 41 U/L 24 24 37  ALT 0 - 44 U/L 27 27 55(H)      RADIOGRAPHIC STUDIES: I have personally reviewed the radiological images as listed and agreed with the findings in the report. No results found.   ASSESSMENT & PLAN:  KENTLEY CEDILLO is a 55 y.o. female with   1. 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 She is s/p b/l lumpectomy and adjuvant radiation. -Her Oncotype recurrence score is 14. Due to low risk disease, adjuvant chemotherapy was not recommended. -She started Tamoxifen on 04/21/17 and has been tolerating moderately well with hot flashes and insomnia and weight gain. -Her last period was 08/07/17 before starting Tamoxifen. Her [place] and Estradial levels from  06/2019 showed she was still perimenopausal. Will continue Tamoxifen for now and when she is found to be post-menopausal I will again discuss switching her to AI.  -She last saw Dr. Donne Hazel on 09/26/20 -We discussed diagnostic vs screening mammograms. The recommendation is for 5 years of diagnostic mammograms before going back to screening. She notes the price she pays is only a $25 difference between the two. She is comfortable continuing the diagnostic  mammograms. -She is clinically doing well. Lab reviewed, her CBC is WNL, CMP pending. Her physical exam and her 12/2019 mammogram were unremarkable. There is no clinical concern for recurrence. -Continue Surveillance. Next mammogram in 12/2020 -Continue Tamoxifen. -F/u in 6 months    2. Genetic Testing was negative   3. Insomnia from Hot flashes, Weight gain -She attributes her hot flashes, insomnia, and weight gain to Tamoxifen. -She notes her hot flashes are manageable. She declined any medications to help.  -Since initially gaining weight on Tamoxifen to 150 pounds, she struggles to lose this weight. Weight has been stable at 147.      PLAN:   -Continue Tamoxifen -Lab and f/u in 6 months with NP Lacie    No problem-specific Assessment & Plan notes found for this encounter.   No orders of the defined types were placed in this encounter.  All questions were answered. The patient knows to call the clinic with any problems, questions or concerns. No barriers to learning was detected. The total time spent in  the appointment was 25 minutes.     Truitt Merle, MD 10/06/2020   I, Wilburn Mylar, am acting as scribe for Truitt Merle, MD.   I have reviewed the above documentation for accuracy and completeness, and I agree with the above.

## 2020-11-21 ENCOUNTER — Other Ambulatory Visit: Payer: Self-pay | Admitting: Hematology

## 2020-11-21 DIAGNOSIS — Z9889 Other specified postprocedural states: Secondary | ICD-10-CM

## 2020-12-23 ENCOUNTER — Other Ambulatory Visit: Payer: Self-pay

## 2020-12-23 ENCOUNTER — Ambulatory Visit
Admission: RE | Admit: 2020-12-23 | Discharge: 2020-12-23 | Disposition: A | Discharge status: Home / Self Care | Payer: 59 | Source: Ambulatory Visit | Attending: Hematology | Admitting: Hematology

## 2020-12-23 DIAGNOSIS — Z9889 Other specified postprocedural states: Secondary | ICD-10-CM

## 2021-02-19 ENCOUNTER — Other Ambulatory Visit: Payer: Self-pay | Admitting: Hematology

## 2021-04-11 ENCOUNTER — Inpatient Hospital Stay: Payer: 59

## 2021-04-11 ENCOUNTER — Inpatient Hospital Stay: Payer: 59 | Admitting: Nurse Practitioner

## 2021-04-23 NOTE — Progress Notes (Deleted)
[facility]   Telephone:(336) [phone] Fax:(336) [phone]   Clinic Follow up Note   Patient Care Team: Pamela Millers, MD as PCP - General (Obstetrics and Gynecology) Pamela Quitter, MD as Consulting Physician (Otolaryngology) Pamela Merle, MD as Consulting Physician (Hematology) Pamela Bookbinder, MD as Consulting Physician (General Surgery) 04/23/2021  CHIEF COMPLAINT: Follow up bilateral breast cancer   SUMMARY OF ONCOLOGIC HISTORY: Oncology History Overview Note  Cancer Staging Carcinoma of upper-outer quadrant of right breast in female, estrogen receptor positive ([place]) Staging form: Breast, AJCC 8th Edition - Pathologic stage from 01/18/2017: Stage IA (pT1a, pN0, cM0, G1, ER+, PR+, HER2-) - Signed by Pamela Merle, MD on 06/24/2017 - Clinical: Stage IA (cT1a, cN0(sn), cM0, G1, ER: Positive, PR: Positive, HER2: Negative) - Unsigned  Malignant neoplasm of lower-inner quadrant of left breast in female, estrogen receptor positive ([place]) Staging form: Breast, AJCC 8th Edition - Pathologic stage from 01/18/2017: Stage IA (pT1c, pN0(sn), cM0, G1, ER+, PR+, HER2-, Oncotype DX score: 14) - Signed by Pamela Merle, MD on 06/24/2017     Malignant neoplasm of lower-inner quadrant of left breast in female, estrogen receptor positive ([place])  12/14/2016 Mammogram   Diagnostic mammogram 12/14/16 IMPRESSION: Suspicious 9 x 7 x 9 mm irregular lobular hypoechoic left breast mass at the 630 o'clock 3 cm from the nipple, felt to correspond with mammographic focal architectural distortion.  RECOMMENDATION: Ultrasound-guided core needle biopsy left breast mass.   12/18/2016 Initial Biopsy   Diagnosis 12/18/16 Breast, left, needle core biopsy, 6:30 o'clock - INVASIVE DUCTAL CARCINOMA, GRADE I.    12/18/2016 Receptors her2   Estrogen Receptor: 90%, POSITIVE, STRONG STAINING INTENSITY Progesterone Receptor: 90%, POSITIVE, STRONG STAINING INTENSITY Proliferation Marker Ki67: 5% HER2  NEGATIVE    12/20/2016 Initial Diagnosis   Malignant neoplasm of lower-inner quadrant of left breast in female, estrogen receptor positive ([place])   01/07/2017 Genetic Testing   Patient had genetic testing due to a personal history of breeast cancer and a family history of prostate and colon cancer. The Common Hereditary Cancers Panel + Prostate Cancer Panelwas ordered. The Hereditary Gene Panel offered by [facility] includes sequencing and/or deletion duplication testing of the following 48 genes: APC, ATM, AXIN2, BARD1, BMPR1A, BRCA1, BRCA2, BRIP1, CDH1, CDKN2A (p14ARF), CDKN2A (p16INK4a), CKD4, CHEK2, CTNNA1, DICER1, EPCAM (Deletion/duplication testing only), FANCA, GREM1 (promoter region deletion/duplication testing only), KIT, MEN1, MLH1, MSH2, MSH3, MSH6, MUTYH, NBN, NF1, NHTL1, PALB2, PDGFRA, PMS2, POLD1, POLE, PTEN, RAD50, RAD51C, RAD51D, SDHB, SDHC, SDHD, SMAD4, SMARCA4. STK11, TP53, TSC1, TSC2, and VHL.  The following genes were evaluated for sequence changes only: SDHA and HOXB13 c.251G>A variant only.  Results- Negative, no pathogenic variants identified.  The date of this test report is 01/07/2017   01/18/2017 Surgery   Bilateral Breast lumpectomy with Dr. Donne Whitney    01/18/2017 Pathology Results    Diagnosis 01/18/17 1. Breast, lumpectomy, Left - INVASIVE DUCTAL CARCINOMA, GRADE I/III, SPANNING 1.2 CM. - DUCTAL CARCINOMA IN SITU, LOW GRADE. - THE SURGICAL RESECTION MARGINS ARE NEGATIVE FOR CARCINOMA. - SEE ONCOLOGY TABLE BELOW. 2. Breast, excision, Left additional deep margin - FIBROCYSTIC CHANGES WITH ADENOSIS. - USUAL DUCTAL HYPERPLASIA. - THERE IS NO EVIDENCE OF MALIGNANCY. - SEE COMMENT. 3. Breast, lumpectomy, Right - LOBULAR NEOPLASIA (LOBULAR CARCINOMA IN SITU). - SEE COMMENT. 4. Breast, excision, Right additional Posterior Lateral - INVASIVE LOBULAR CARCINOMA, GRADE I/III, SPANNING, SPANNING 0.4 CM. - THE SURGICAL RESECTION MARGINS ARE NEGATIVE FOR CARCINOMA. - SEE  ONCOLOGY TABLE BELOW. 5. Lymph node, sentinel, biopsy, Right  Axillary - THERE IS NO EVIDENCE OF CARCINOMA IN 1 OF 1 LYMPH NODE (0/1). - SEE COMMENT. 6. Lymph node, sentinel, biopsy, Left Axillary - THERE IS NO EVIDENCE OF CARCINOMA IN 1 OF 1 LYMPH NODE (0/1). - SEE COMMENT. 7. Breast, excision, Left additional Inferior Margin - FIBROCYSTIC CHANGES. - THERE IS NO EVIDENCE OF MALIGNANCY. - SEE COMMENT.    01/18/2017 Oncotype testing   Her oncotype recurrence score is 14 and her distant recurrence on Tamoxifen is 10%.   03/12/2017 - 04/08/2017 Radiation Therapy   Radiation therapy with Dr. Lisbeth Whitney   04/21/2017 -  Anti-estrogen oral therapy   20 mg Tamoxifen daily    12/05/2017 Imaging   Diagnostic bilateral mammogram IMPRESSION: Interval bilateral posttreatment changes without mammographic evidence of malignancy.   12/20/2017 Imaging   Bilateral breast MRI  IMPRESSION: Expected postoperative changes bilaterally. 5 millimeter mass in the upper inner quadrant of the left breast warranting tissue diagnosis.    01/03/2018 Pathology Results   Breast, left, needle core biopsy, UIQ at posterior depth with fibrocystic changes. There is no evidence of malignancy.   Carcinoma of upper-outer quadrant of right breast in female, estrogen receptor positive ([place])  01/18/2017 Cancer Staging   Staging form: Breast, AJCC 8th Edition - Pathologic stage from 01/18/2017: Stage IA (pT1a, pN0, cM0, G1, ER+, PR+, HER2-) - Signed by Pamela Merle, MD on 06/24/2017    02/12/2017 Initial Diagnosis   Carcinoma of upper-outer quadrant of right breast in female, estrogen receptor positive ([place])     CURRENT THERAPY: Tamoxifen 20 mg daily, started 04/21/17  INTERVAL HISTORY: Pamela Whitney returns for follow up as scheduled. Last seen by Pamela Whitney 10/06/20. Mammogram 12/23/20 showed no evidence of malignancy, with breast density cat C. She continues tamoxifen ***   REVIEW OF SYSTEMS:   Constitutional: Denies fevers, chills  or abnormal weight loss Eyes: Denies blurriness of vision Ears, nose, mouth, throat, and face: Denies mucositis or sore throat Respiratory: Denies cough, dyspnea or wheezes Cardiovascular: Denies palpitation, chest discomfort or lower extremity swelling Gastrointestinal:  Denies nausea, heartburn or change in bowel habits Skin: Denies abnormal skin rashes Lymphatics: Denies new lymphadenopathy or easy bruising Neurological:Denies numbness, tingling or new weaknesses Behavioral/Psych: Mood is stable, no new changes  All other systems were reviewed with the patient and are negative.  MEDICAL HISTORY:  Past Medical History:  Diagnosis Date   Allergy    PCNS   Breast cancer ([place]) 01/2017   Bilateral   Family history of colon cancer    Family history of prostate cancer in father    Personal history of radiation therapy     SURGICAL HISTORY: Past Surgical History:  Procedure Laterality Date   BREAST LUMPECTOMY Bilateral 2018   CESAREAN SECTION     RADIOACTIVE SEED GUIDED PARTIAL MASTECTOMY WITH AXILLARY SENTINEL LYMPH NODE BIOPSY Bilateral 01/18/2017   Procedure: BILATERAL BREAST LUMPECTOMY WITH  BILATERAL RADIOACTIVE SEED AND BILATERAL SENTINEL LYMPH NODE BIOPSY;  Surgeon: Pamela Bookbinder, MD;  Location: [facility];  Service: General;  Laterality: Bilateral;   RHINOPLASTY     WISDOM TOOTH EXTRACTION      I have reviewed the social history and family history with the patient and they are unchanged from previous note.  ALLERGIES:  is allergic to penicillins.  MEDICATIONS:  Current Outpatient Medications  Medication Sig Dispense Refill   ibuprofen (ADVIL,MOTRIN) 200 MG tablet Take 200 mg every 6 (six) hours as needed by mouth.     tamoxifen (NOLVADEX) 20 MG  tablet TAKE 1 TABLET BY MOUTH EVERY DAY 30 tablet 5   No current facility-administered medications for this visit.    PHYSICAL EXAMINATION: ECOG PERFORMANCE STATUS: {CHL ONC ECOG PS:[id number]}  There  were no vitals filed for this visit. There were no vitals filed for this visit.  GENERAL:alert, no distress and comfortable SKIN: skin color, texture, turgor are normal, no rashes or significant lesions EYES: normal, Conjunctiva are pink and non-injected, sclera clear OROPHARYNX:no exudate, no erythema and lips, buccal mucosa, and tongue normal  NECK: supple, thyroid normal size, non-tender, without nodularity LYMPH:  no palpable lymphadenopathy in the cervical, axillary or inguinal LUNGS: clear to auscultation and percussion with normal breathing effort HEART: regular rate & rhythm and no murmurs and no lower extremity edema ABDOMEN:abdomen soft, non-tender and normal bowel sounds Musculoskeletal:no cyanosis of digits and no clubbing  NEURO: alert & oriented x 3 with fluent speech, no focal motor/sensory deficits  LABORATORY DATA:  I have reviewed the data as listed CBC Latest Ref Rng & Units 10/06/2020 03/15/2020 06/19/2019  WBC 4.0 - 10.5 K/uL 4.4 4.4 4.8  Hemoglobin 12.0 - 15.0 g/dL 12.8 12.9 13.1  Hematocrit 36.0 - 46.0 % 36.8 37.2 37.9  Platelets 150 - 400 K/uL 202 195 197     CMP Latest Ref Rng & Units 10/06/2020 03/15/2020 06/19/2019  Glucose 70 - 99 mg/dL 99 107(H) 121(H)  BUN 6 - 20 mg/dL _0 Creatinine 0.44 - 1.00 mg/dL 0.94 1.07(H) 0.93  Sodium 135 - 145 mmol/L 141 141 141  Potassium 3.5 - 5.1 mmol/L 4.5 3.9 4.2  Chloride 98 - 111 mmol/L 107 107 105  CO2 22 - 32 mmol/L _1 Calcium 8.9 - 10.3 mg/dL 9.5 9.3 8.8(L)  Total Protein 6.5 - 8.1 g/dL 7.3 7.3 7.3  Total Bilirubin 0.3 - 1.2 mg/dL 0.5 0.5 0.5  Alkaline Phos 38 - 126 U/L 51 54 55  AST 15 - 41 U/L 24 24 37  ALT 0 - 44 U/L 27 27 55(H)      RADIOGRAPHIC STUDIES: I have personally reviewed the radiological images as listed and agreed with the findings in the report. No results found.   ASSESSMENT & PLAN:  No problem-specific Assessment & Plan notes found for this encounter.   No orders of the defined  types were placed in this encounter.  All questions were answered. The patient knows to call the clinic with any problems, questions or concerns. No barriers to learning was detected. I spent {CHL ONC TIME VISIT - BOMQT:[id number]} counseling the patient face to face. The total time spent in the appointment was {CHL ONC TIME VISIT - QVLDK:[id number]} and more than 50% was on counseling and review of test results     Alla Feeling, NP 04/23/21

## 2021-04-24 NOTE — Progress Notes (Signed)
°[facility]   °[phone] [phone]   °Clinic Follow up Note  ° °Patient Care Team: °Anderson, Mark E, MD as PCP - General (Obstetrics and Gynecology) °Bates, Dwight, MD as Consulting Physician (Otolaryngology) °Feng, Yan, MD as Consulting Physician (Hematology) °Wakefield, Matthew, MD as Consulting Physician (General Surgery) °04/26/2021 ° °CHIEF COMPLAINT: Follow up bilateral breast cancer  ° °SUMMARY OF ONCOLOGIC HISTORY: °Oncology History Overview Note  °Cancer Staging °Carcinoma of upper-outer quadrant of right breast in female, estrogen receptor positive (HCC) °Staging form: Breast, AJCC 8th Edition °- Pathologic stage from 01/18/2017: Stage IA (pT1a, pN0, cM0, G1, ER+, PR+, HER2-) - Signed by Feng, Yan, MD on 06/24/2017 °- Clinical: Stage IA (cT1a, cN0(sn), cM0, G1, ER: Positive, PR: Positive, HER2: Negative) - Unsigned ° °Malignant neoplasm of lower-inner quadrant of left breast in female, estrogen receptor positive (HCC) °Staging form: Breast, AJCC 8th Edition °- Pathologic stage from 01/18/2017: Stage IA (pT1c, pN0(sn), cM0, G1, ER+, PR+, HER2-, Oncotype DX score: 14) - Signed by Feng, Yan, MD on 06/24/2017 ° ° °  °Malignant neoplasm of lower-inner quadrant of left breast in female, estrogen receptor positive (HCC)  °12/14/2016 Mammogram  ° Diagnostic mammogram 12/14/16 °IMPRESSION: °Suspicious 9 x 7 x 9 mm irregular lobular hypoechoic left breast mass at the 630 o'clock 3 cm from the °nipple, felt to correspond °with mammographic focal architectural distortion. ° RECOMMENDATION: °Ultrasound-guided core needle biopsy left breast mass. °  °12/18/2016 Initial Biopsy  ° Diagnosis 12/18/16 °Breast, left, needle core biopsy, 6:30 o'clock °- INVASIVE DUCTAL CARCINOMA, GRADE I. ° °  °12/18/2016 Receptors her2  ° Estrogen Receptor: 90%, POSITIVE, STRONG STAINING INTENSITY °Progesterone Receptor: 90%, POSITIVE, STRONG STAINING INTENSITY °Proliferation Marker Ki67: 5% °HER2  NEGATIVE  °  °12/20/2016 Initial Diagnosis  ° Malignant neoplasm of lower-inner quadrant of left breast in female, estrogen receptor positive (HCC) °  °01/07/2017 Genetic Testing  ° Patient had genetic testing due to a personal history of breeast cancer and a family history of prostate and colon cancer. The Common Hereditary Cancers Panel + Prostate Cancer Panelwas ordered. The Hereditary Gene Panel offered by [facility] includes sequencing and/or deletion duplication testing of the following 48 genes: APC, ATM, AXIN2, BARD1, BMPR1A, BRCA1, BRCA2, BRIP1, CDH1, CDKN2A (p14ARF), CDKN2A (p16INK4a), CKD4, CHEK2, CTNNA1, DICER1, EPCAM (Deletion/duplication testing only), FANCA, GREM1 (promoter region deletion/duplication testing only), KIT, MEN1, MLH1, MSH2, MSH3, MSH6, MUTYH, NBN, NF1, NHTL1, PALB2, PDGFRA, PMS2, POLD1, POLE, PTEN, RAD50, RAD51C, RAD51D, SDHB, SDHC, SDHD, SMAD4, SMARCA4. STK11, TP53, TSC1, TSC2, and VHL.  The following genes were evaluated for sequence changes only: SDHA and HOXB13 c.251G>A variant only. ° °Results- Negative, no pathogenic variants identified.  °The date of this test report is 01/07/2017 °  °01/18/2017 Surgery  ° Bilateral Breast lumpectomy with Dr. Wakefield  °  °01/18/2017 Pathology Results  °  °Diagnosis 01/18/17 °1. Breast, lumpectomy, Left °- INVASIVE DUCTAL CARCINOMA, GRADE I/III, SPANNING 1.2 CM. °- DUCTAL CARCINOMA IN SITU, LOW GRADE. °- THE SURGICAL RESECTION MARGINS ARE NEGATIVE FOR CARCINOMA. °- SEE ONCOLOGY TABLE BELOW. °2. Breast, excision, Left additional deep margin °- FIBROCYSTIC CHANGES WITH ADENOSIS. °- USUAL DUCTAL HYPERPLASIA. °- THERE IS NO EVIDENCE OF MALIGNANCY. °- SEE COMMENT. °3. Breast, lumpectomy, Right °- LOBULAR NEOPLASIA (LOBULAR CARCINOMA IN SITU). °- SEE COMMENT. °4. Breast, excision, Right additional Posterior Lateral °- INVASIVE LOBULAR CARCINOMA, GRADE I/III, SPANNING, SPANNING 0.4 CM. °- THE SURGICAL RESECTION MARGINS ARE NEGATIVE FOR CARCINOMA. °- SEE  ONCOLOGY TABLE BELOW. °5. Lymph node, sentinel, biopsy, Right   Axillary °- THERE IS NO EVIDENCE OF CARCINOMA IN 1 OF 1 LYMPH NODE (0/1). °- SEE COMMENT. °6. Lymph node, sentinel, biopsy, Left Axillary °- THERE IS NO EVIDENCE OF CARCINOMA IN 1 OF 1 LYMPH NODE (0/1). °- SEE COMMENT. °7. Breast, excision, Left additional Inferior Margin °- FIBROCYSTIC CHANGES. °- THERE IS NO EVIDENCE OF MALIGNANCY. °- SEE COMMENT. ° °  °01/18/2017 Oncotype testing  ° Her oncotype recurrence score is 14 and her distant recurrence on Tamoxifen is 10%. °  °03/12/2017 - 04/08/2017 Radiation Therapy  ° Radiation therapy with Dr. Moody °  °04/21/2017 -  Anti-estrogen oral therapy  ° 20 mg Tamoxifen daily  °  °12/05/2017 Imaging  ° Diagnostic bilateral mammogram °IMPRESSION: Interval bilateral posttreatment changes without mammographic evidence of malignancy. °  °12/20/2017 Imaging  ° Bilateral breast MRI  °IMPRESSION: Expected postoperative changes bilaterally. 5 millimeter mass in the upper inner quadrant of the left breast warranting tissue diagnosis.  °  °01/03/2018 Pathology Results  ° Breast, left, needle core biopsy, UIQ at posterior depth with fibrocystic changes. There is no evidence of malignancy. °  °Carcinoma of upper-outer quadrant of right breast in female, estrogen receptor positive (HCC)  °01/18/2017 Cancer Staging  ° Staging form: Breast, AJCC 8th Edition °- Pathologic stage from 01/18/2017: Stage IA (pT1a, pN0, cM0, G1, ER+, PR+, HER2-) - Signed by Feng, Yan, MD on 06/24/2017 ° °  °02/12/2017 Initial Diagnosis  ° Carcinoma of upper-outer quadrant of right breast in female, estrogen receptor positive (HCC) °  ° ° °CURRENT THERAPY: Tamoxifen daily, starting 04/2017 ° °INTERVAL HISTORY: Ms. Pavlik returns for follow up as scheduled. Last seen by Dr. Feng 10/06/20. Mammogram 12/23/20 showed postop changes, no malignancy, and breast density cat C. She continues tamoxifen.  She has difficulty sleeping and frustration over not being able to  lose 20 pounds.  Her hot flashes have increased at night, moderate.  She tried gabapentin for sleep that was not effective for insomnia or hot flashes.  She would rather not take any additional medication.  She has not had a period since 2019.  She is up-to-date on age-appropriate health maintenance and cancer screenings.  She continues to work.  Denies new breast lump/mass, nipple discharge or inversion, or skin change, recent fever, chills, cough, chest pain, dyspnea, bleeding, abdominal pain or bloating, change in GI habits, bone pain, or any other new complaints. ° ° ° °MEDICAL HISTORY:  °Past Medical History:  °Diagnosis Date  ° Allergy   ° PCNS  ° Breast cancer (HCC) 01/2017  ° Bilateral  ° Family history of colon cancer   ° Family history of prostate cancer in father   ° Personal history of radiation therapy   ° ° °SURGICAL HISTORY: °Past Surgical History:  °Procedure Laterality Date  ° BREAST LUMPECTOMY Bilateral 2018  ° CESAREAN SECTION    ° RADIOACTIVE SEED GUIDED PARTIAL MASTECTOMY WITH AXILLARY SENTINEL LYMPH NODE BIOPSY Bilateral 01/18/2017  ° Procedure: BILATERAL BREAST LUMPECTOMY WITH  BILATERAL RADIOACTIVE SEED AND BILATERAL SENTINEL LYMPH NODE BIOPSY;  Surgeon: Wakefield, Matthew, MD;  Location: [facility];  Service: General;  Laterality: Bilateral;  ° RHINOPLASTY    ° WISDOM TOOTH EXTRACTION    ° ° °I have reviewed the social history and family history with the patient and they are unchanged from previous note. ° °ALLERGIES:  is allergic to penicillins. ° °MEDICATIONS:  °Current Outpatient Medications  °Medication Sig Dispense Refill  ° ibuprofen (ADVIL,MOTRIN) 200 MG tablet Take 200 mg every 6 (  6 (six) hours as needed by mouth.     tamoxifen (NOLVADEX) 20 MG tablet TAKE 1 TABLET BY MOUTH EVERY DAY 30 tablet 5   No current facility-administered medications for this visit.    PHYSICAL EXAMINATION: ECOG PERFORMANCE STATUS: 1 - Symptomatic but completely ambulatory  Vitals:    04/26/21 1007  BP: 126/77  Pulse: 65  Resp: 17  Temp: 98.4 F (36.9 C)  SpO2: 100%   Filed Weights   04/26/21 1007  Weight: 148 lb 2 oz (67.2 kg)    GENERAL:alert, no distress and comfortable SKIN: No rash EYES: sclera clear NECK: Without mass LYMPH:  no palpable cervical or supraclavicular lymphadenopathy LUNGS: clear with normal breathing effort HEART: regular rate & rhythm, no lower extremity edema ABDOMEN:abdomen soft, non-tender and normal bowel sounds Musculoskeletal: No focal spinal tenderness NEURO: alert & oriented x 3 with fluent speech, no focal motor/sensory deficits Breast exam: Breasts are symmetrical without nipple discharge or inversion.  S/p bilateral lumpectomies, incisions completely healed.  Fibroglandular tissue noted.  No palpable mass or nodularity in either breast or axilla that I could appreciate  LABORATORY DATA:  I have reviewed the data as listed CBC Latest Ref Rng & Units 04/26/2021 10/06/2020 03/15/2020  WBC 4.0 - 10.5 K/uL 4.3 4.4 4.4  Hemoglobin 12.0 - 15.0 g/dL 12.6 12.8 12.9  Hematocrit 36.0 - 46.0 % 36.9 36.8 37.2  Platelets 150 - 400 K/uL 190 202 195     CMP Latest Ref Rng & Units 04/26/2021 10/06/2020 03/15/2020  Glucose 70 - 99 mg/dL 102(H) 99 107(H)  BUN 6 - 20 mg/dL _0 Creatinine 0.44 - 1.00 mg/dL 0.96 0.94 1.07(H)  Sodium 135 - 145 mmol/L 141 141 141  Potassium 3.5 - 5.1 mmol/L 4.3 4.5 3.9  Chloride 98 - 111 mmol/L 106 107 107  CO2 22 - 32 mmol/L _1 Calcium 8.9 - 10.3 mg/dL 9.3 9.5 9.3  Total Protein 6.5 - 8.1 g/dL 7.1 7.3 7.3  Total Bilirubin 0.3 - 1.2 mg/dL 0.4 0.5 0.5  Alkaline Phos 38 - 126 U/L 52 51 54  AST 15 - 41 U/L _2 ALT 0 - 44 U/L 32 27 27      RADIOGRAPHIC STUDIES: I have personally reviewed the radiological images as listed and agreed with the findings in the report. No results found.   ASSESSMENT & PLAN: ANGELIYAH Whitney is a 56 y.o. female with    1. 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 S/p b/l lumpectomy and adjuvant radiation. -Her Oncotype recurrence score is 14. Due to low risk disease, adjuvant chemotherapy was not recommended. -She started Tamoxifen on 04/21/17 and has been tolerating moderately well with hot flashes and insomnia and inability to lose weight.  Gabapentin was not helpful. -Her last period was 08/07/17 before starting Tamoxifen. Her Albert and Estradial levels from  06/2019 showed she was still perimenopausal. Will continue Tamoxifen for now and when she is found to be post-menopausal, we will discuss switching her to AI.  -Mammogram 12/23/2020 was negative, breast density category C.  Repeat 12/2021 for last diagnostic mammo then switch back to 3D screening  -Continue Surveillance and tamoxifen -Follow-up in 6 months, or sooner if needed   2. Genetic Testing  negative °  °3. Insomnia from Hot flashes, Weight gain °-She has moderate hot flashes and insomnia, and difficulty losing weight  °-She tried gabapentin which was not helpful.  She prefers not to add other medications at this time °-Manageable ° °4.  Age-appropriate health maintenance °-She is reportedly up-to-date on age-appropriate cancer screenings.  Continues follow-up with PCP and OB/GYN °-Recently began shingles vaccine series °  °  ° °Disposition: °Ms. Michelini is clinically doing well.  Tolerating tamoxifen moderately well with insomnia and hot flashes.  She prefers to manage these without medication.  Breast exam is benign, CBC and CMP are normal.  There is no clinical concern for breast cancer recurrence. ° °She will return to lab for FSH/estradiol at her convenience, I will call her with the result.  If she is postmenopausal, I will call her  to discuss switching to AI. ° °She is 4.5 years from initial diagnosis and has completed 4 years of tamoxifen. The recurrence risk has decreased. Continue surveillance and tamoxifen for now, next routine visit in 6 months, or sooner if needed. ° °Orders Placed This Encounter  °Procedures  ° MM DIAG BREAST TOMO BILATERAL  °  Standing Status:   Future  °  Standing Expiration Date:   04/26/2022  °  Order Specific Question:   Reason for Exam (SYMPTOM  OR DIAGNOSIS REQUIRED)  °  Answer:   History of bilateral breast cancer 2018  °  Order Specific Question:   Is the patient pregnant?  °  Answer:   No  °  Order Specific Question:   Preferred imaging location?  °  Answer:   [facility]  ° [facility]  °  Standing Status:   Standing  °  Number of Occurrences:   1  °  Standing Expiration Date:   04/26/2022  ° FSH-Follicle stimulating hormone  °  Standing Status:   Standing  °  Number of Occurrences:   1  °  Standing Expiration Date:   04/26/2022  ° °All questions were answered. The patient knows to call the clinic with any problems, questions or concerns. No barriers to learning were detected. ° °  ° Lacie K Burton, NP °04/26/21  ° °  °

## 2021-04-25 ENCOUNTER — Inpatient Hospital Stay: Payer: 59 | Admitting: Nurse Practitioner

## 2021-04-25 ENCOUNTER — Inpatient Hospital Stay: Payer: 59

## 2021-04-26 ENCOUNTER — Inpatient Hospital Stay (HOSPITAL_BASED_OUTPATIENT_CLINIC_OR_DEPARTMENT_OTHER): Payer: BC Managed Care – PPO | Admitting: Nurse Practitioner

## 2021-04-26 ENCOUNTER — Other Ambulatory Visit: Payer: Self-pay

## 2021-04-26 ENCOUNTER — Inpatient Hospital Stay: Payer: BC Managed Care – PPO | Attending: Nurse Practitioner

## 2021-04-26 ENCOUNTER — Encounter: Payer: Self-pay | Admitting: Nurse Practitioner

## 2021-04-26 VITALS — BP 126/77 | HR 65 | Temp 98.4°F | Resp 17 | Wt 148.1 lb

## 2021-04-26 DIAGNOSIS — Z923 Personal history of irradiation: Secondary | ICD-10-CM | POA: Insufficient documentation

## 2021-04-26 DIAGNOSIS — Z79810 Long term (current) use of selective estrogen receptor modulators (SERMs): Secondary | ICD-10-CM | POA: Diagnosis not present

## 2021-04-26 DIAGNOSIS — Z170 Estrogen receptor positive status [ER+]: Secondary | ICD-10-CM | POA: Diagnosis not present

## 2021-04-26 DIAGNOSIS — C50312 Malignant neoplasm of lower-inner quadrant of left female breast: Secondary | ICD-10-CM | POA: Diagnosis not present

## 2021-04-26 DIAGNOSIS — C50411 Malignant neoplasm of upper-outer quadrant of right female breast: Secondary | ICD-10-CM

## 2021-04-26 DIAGNOSIS — N951 Menopausal and female climacteric states: Secondary | ICD-10-CM | POA: Diagnosis not present

## 2021-04-26 DIAGNOSIS — G4700 Insomnia, unspecified: Secondary | ICD-10-CM | POA: Diagnosis not present

## 2021-04-26 DIAGNOSIS — Z9011 Acquired absence of right breast and nipple: Secondary | ICD-10-CM | POA: Insufficient documentation

## 2021-04-26 LAB — CBC WITH DIFFERENTIAL (CANCER CENTER ONLY)
Abs Immature Granulocytes: 0.02 10*3/uL (ref 0.00–0.07)
Basophils Absolute: 0 10*3/uL (ref 0.0–0.1)
Basophils Relative: 1 %
Eosinophils Absolute: 0.1 10*3/uL (ref 0.0–0.5)
Eosinophils Relative: 2 %
HCT: 36.9 % (ref 36.0–46.0)
Hemoglobin: 12.6 g/dL (ref 12.0–15.0)
Immature Granulocytes: 1 %
Lymphocytes Relative: 38 %
Lymphs Abs: 1.6 10*3/uL (ref 0.7–4.0)
MCH: 32 pg (ref 26.0–34.0)
MCHC: 34.1 g/dL (ref 30.0–36.0)
MCV: 93.7 fL (ref 80.0–100.0)
Monocytes Absolute: 0.4 10*3/uL (ref 0.1–1.0)
Monocytes Relative: 9 %
Neutro Abs: 2.1 10*3/uL (ref 1.7–7.7)
Neutrophils Relative %: 49 %
Platelet Count: 190 10*3/uL (ref 150–400)
RBC: 3.94 MIL/uL (ref 3.87–5.11)
RDW: 11.9 % (ref 11.5–15.5)
WBC Count: 4.3 10*3/uL (ref 4.0–10.5)
nRBC: 0 % (ref 0.0–0.2)

## 2021-04-26 LAB — CMP (CANCER CENTER ONLY)
ALT: 32 U/L (ref 0–44)
AST: 28 U/L (ref 15–41)
Albumin: 4.2 g/dL (ref 3.5–5.0)
Alkaline Phosphatase: 52 U/L (ref 38–126)
Anion gap: 7 (ref 5–15)
BUN: 15 mg/dL (ref 6–20)
CO2: 28 mmol/L (ref 22–32)
Calcium: 9.3 mg/dL (ref 8.9–10.3)
Chloride: 106 mmol/L (ref 98–111)
Creatinine: 0.96 mg/dL (ref 0.44–1.00)
GFR, Estimated: >60 mL/min (ref >60)
Glucose, Bld: 102 mg/dL — ABNORMAL HIGH (ref 70–99)
Potassium: 4.3 mmol/L (ref 3.5–5.1)
Sodium: 141 mmol/L (ref 135–145)
Total Bilirubin: 0.4 mg/dL (ref 0.3–1.2)
Total Protein: 7.1 g/dL (ref 6.5–8.1)

## 2021-04-27 ENCOUNTER — Telehealth: Payer: Self-pay | Admitting: Hematology

## 2021-04-27 NOTE — Telephone Encounter (Signed)
Scheduled follow-up appointment per 2/22 los. Patient is aware. °

## 2021-07-05 ENCOUNTER — Other Ambulatory Visit: Payer: Self-pay | Admitting: Hematology

## 2021-10-24 ENCOUNTER — Inpatient Hospital Stay: Payer: BC Managed Care – PPO

## 2021-10-24 ENCOUNTER — Inpatient Hospital Stay: Payer: BC Managed Care – PPO | Admitting: Nurse Practitioner

## 2021-10-31 NOTE — Progress Notes (Unsigned)
[facility]   [phone] [phone]   Clinic Follow up Note   Patient Care Team: Olga Millers, MD as PCP - General (Obstetrics and Gynecology) Melida Quitter, MD as Consulting Physician (Otolaryngology) Truitt Merle, MD as Consulting Physician (Hematology) Rolm Bookbinder, MD as Consulting Physician (General Surgery) 11/01/2021  CHIEF COMPLAINT: Follow-up bilateral breast cancer  SUMMARY OF ONCOLOGIC HISTORY: Oncology History Overview Note  Cancer Staging Carcinoma of upper-outer quadrant of right breast in female, estrogen receptor positive ([place]) Staging form: Breast, AJCC 8th Edition - Pathologic stage from 01/18/2017: Stage IA (pT1a, pN0, cM0, G1, ER+, PR+, HER2-) - Signed by Truitt Merle, MD on 06/24/2017 - Clinical: Stage IA (cT1a, cN0(sn), cM0, G1, ER: Positive, PR: Positive, HER2: Negative) - Unsigned  Malignant neoplasm of lower-inner quadrant of left breast in female, estrogen receptor positive ([place]) Staging form: Breast, AJCC 8th Edition - Pathologic stage from 01/18/2017: Stage IA (pT1c, pN0(sn), cM0, G1, ER+, PR+, HER2-, Oncotype DX score: 14) - Signed by Truitt Merle, MD on 06/24/2017     Malignant neoplasm of lower-inner quadrant of left breast in female, estrogen receptor positive ([place])  12/14/2016 Mammogram   Diagnostic mammogram 12/14/16 IMPRESSION: Suspicious 9 x 7 x 9 mm irregular lobular hypoechoic left breast mass at the 630 o'clock 3 cm from the nipple, felt to correspond with mammographic focal architectural distortion.  RECOMMENDATION: Ultrasound-guided core needle biopsy left breast mass.   12/18/2016 Initial Biopsy   Diagnosis 12/18/16 Breast, left, needle core biopsy, 6:30 o'clock - INVASIVE DUCTAL CARCINOMA, GRADE I.    12/18/2016 Receptors her2   Estrogen Receptor: 90%, POSITIVE, STRONG STAINING INTENSITY Progesterone Receptor: 90%, POSITIVE, STRONG STAINING INTENSITY Proliferation Marker Ki67: 5% HER2  NEGATIVE    12/20/2016 Initial Diagnosis   Malignant neoplasm of lower-inner quadrant of left breast in female, estrogen receptor positive ([place])   01/07/2017 Genetic Testing   Patient had genetic testing due to a personal history of breeast cancer and a family history of prostate and colon cancer. The Common Hereditary Cancers Panel + Prostate Cancer Panelwas ordered. The Hereditary Gene Panel offered by [facility] includes sequencing and/or deletion duplication testing of the following 48 genes: APC, ATM, AXIN2, BARD1, BMPR1A, BRCA1, BRCA2, BRIP1, CDH1, CDKN2A (p14ARF), CDKN2A (p16INK4a), CKD4, CHEK2, CTNNA1, DICER1, EPCAM (Deletion/duplication testing only), FANCA, GREM1 (promoter region deletion/duplication testing only), KIT, MEN1, MLH1, MSH2, MSH3, MSH6, MUTYH, NBN, NF1, NHTL1, PALB2, PDGFRA, PMS2, POLD1, POLE, PTEN, RAD50, RAD51C, RAD51D, SDHB, SDHC, SDHD, SMAD4, SMARCA4. STK11, TP53, TSC1, TSC2, and VHL.  The following genes were evaluated for sequence changes only: SDHA and HOXB13 c.251G>A variant only.  Results- Negative, no pathogenic variants identified.  The date of this test report is 01/07/2017   01/18/2017 Surgery   Bilateral Breast lumpectomy with Dr. Donne Hazel    01/18/2017 Pathology Results    Diagnosis 01/18/17 1. Breast, lumpectomy, Left - INVASIVE DUCTAL CARCINOMA, GRADE I/III, SPANNING 1.2 CM. - DUCTAL CARCINOMA IN SITU, LOW GRADE. - THE SURGICAL RESECTION MARGINS ARE NEGATIVE FOR CARCINOMA. - SEE ONCOLOGY TABLE BELOW. 2. Breast, excision, Left additional deep margin - FIBROCYSTIC CHANGES WITH ADENOSIS. - USUAL DUCTAL HYPERPLASIA. - THERE IS NO EVIDENCE OF MALIGNANCY. - SEE COMMENT. 3. Breast, lumpectomy, Right - LOBULAR NEOPLASIA (LOBULAR CARCINOMA IN SITU). - SEE COMMENT. 4. Breast, excision, Right additional Posterior Lateral - INVASIVE LOBULAR CARCINOMA, GRADE I/III, SPANNING, SPANNING 0.4 CM. - THE SURGICAL RESECTION MARGINS ARE NEGATIVE FOR CARCINOMA. - SEE  ONCOLOGY TABLE BELOW. 5. Lymph node, sentinel, biopsy, Right Axillary -  THERE IS NO EVIDENCE OF CARCINOMA IN 1 OF 1 LYMPH NODE (0/1). - SEE COMMENT. 6. Lymph node, sentinel, biopsy, Left Axillary - THERE IS NO EVIDENCE OF CARCINOMA IN 1 OF 1 LYMPH NODE (0/1). - SEE COMMENT. 7. Breast, excision, Left additional Inferior Margin - FIBROCYSTIC CHANGES. - THERE IS NO EVIDENCE OF MALIGNANCY. - SEE COMMENT.    01/18/2017 Oncotype testing   Her oncotype recurrence score is 14 and her distant recurrence on Tamoxifen is 10%.   03/12/2017 - 04/08/2017 Radiation Therapy   Radiation therapy with Dr. Lisbeth Renshaw   04/21/2017 -  Anti-estrogen oral therapy   20 mg Tamoxifen daily    12/05/2017 Imaging   Diagnostic bilateral mammogram IMPRESSION: Interval bilateral posttreatment changes without mammographic evidence of malignancy.   12/20/2017 Imaging   Bilateral breast MRI  IMPRESSION: Expected postoperative changes bilaterally. 5 millimeter mass in the upper inner quadrant of the left breast warranting tissue diagnosis.    01/03/2018 Pathology Results   Breast, left, needle core biopsy, UIQ at posterior depth with fibrocystic changes. There is no evidence of malignancy.   Carcinoma of upper-outer quadrant of right breast in female, estrogen receptor positive ([place])  01/18/2017 Cancer Staging   Staging form: Breast, AJCC 8th Edition - Pathologic stage from 01/18/2017: Stage IA (pT1a, pN0, cM0, G1, ER+, PR+, HER2-) - Signed by Truitt Merle, MD on 06/24/2017   02/12/2017 Initial Diagnosis   Carcinoma of upper-outer quadrant of right breast in female, estrogen receptor positive ([place])     CURRENT THERAPY: Tamoxifen daily, starting 04/2017  INTERVAL HISTORY: Ms. Leitzke returns for follow-up as scheduled, last seen by me 04/26/2021.  She continues tamoxifen, insomnia has been a chronic issue but in the past 2 months she has had more hot flashes all night which are miserable.  She has not gotten more than 4-5 hours  of sleep in the past 4 years, tried gabapentin and melatonin but does not stick with anything for long so not sure if it really helps. She hopes labs show post menopause and she can try AI.  Has occasional shooting pain in her axilla since surgery, denies new lump/mass, nipple discharge or inversion, or skin change.  She is stressed at work, otherwise doing well without new specific complaints.  All other systems were reviewed with the patient and are negative.  MEDICAL HISTORY:  Past Medical History:  Diagnosis Date   Allergy    PCNS   Breast cancer ([place]) 01/2017   Bilateral   Family history of colon cancer    Family history of prostate cancer in father    Personal history of radiation therapy     SURGICAL HISTORY: Past Surgical History:  Procedure Laterality Date   BREAST LUMPECTOMY Bilateral 2018   CESAREAN SECTION     RADIOACTIVE SEED GUIDED PARTIAL MASTECTOMY WITH AXILLARY SENTINEL LYMPH NODE BIOPSY Bilateral 01/18/2017   Procedure: BILATERAL BREAST LUMPECTOMY WITH  BILATERAL RADIOACTIVE SEED AND BILATERAL SENTINEL LYMPH NODE BIOPSY;  Surgeon: Rolm Bookbinder, MD;  Location: [facility];  Service: General;  Laterality: Bilateral;   RHINOPLASTY     WISDOM TOOTH EXTRACTION      I have reviewed the social history and family history with the patient and they are unchanged from previous note.  ALLERGIES:  is allergic to penicillins.  MEDICATIONS:  Current Outpatient Medications  Medication Sig Dispense Refill   ibuprofen (ADVIL,MOTRIN) 200 MG tablet Take 200 mg every 6 (six) hours as needed by mouth.     tamoxifen (NOLVADEX)  20 MG tablet TAKE 1 TABLET BY MOUTH EVERY DAY 30 tablet 5   No current facility-administered medications for this visit.    PHYSICAL EXAMINATION: ECOG PERFORMANCE STATUS: 1 - Symptomatic but completely ambulatory  Vitals:   11/01/21 1230  BP: 125/88  Pulse: 62  Resp: 16  Temp: 98 F (36.7 C)  SpO2: 100%   Filed Weights    11/01/21 1230  Weight: 145 lb (65.8 kg)    GENERAL:alert, no distress and comfortable SKIN: No rash EYES: sclera clear NECK: Without mass LYMPH:  no palpable cervical or supraclavicular lymphadenopathy LUNGS:  normal breathing effort HEART: no lower extremity edema ABDOMEN:abdomen soft, non-tender  Musculoskeletal: No focal spinal tenderness NEURO: alert & oriented x 3 with fluent speech, no focal motor/sensory deficits Breast exam: Breasts are symmetrical without nipple discharge or inversion.  S/p bilateral lumpectomies, incisions completely healed.  Scattered dense tissue without palpable mass or nodularity in either breast or axilla that I could appreciate.  LABORATORY DATA:  I have reviewed the data as listed    Latest Ref Rng & Units 11/01/2021   12:02 PM 04/26/2021    9:52 AM 10/06/2020    9:04 AM  CBC  WBC 4.0 - 10.5 K/uL 5.9  4.3  4.4   Hemoglobin 12.0 - 15.0 g/dL 13.7  12.6  12.8   Hematocrit 36.0 - 46.0 % 38.9  36.9  36.8   Platelets 150 - 400 K/uL 215  190  202         Latest Ref Rng & Units 11/01/2021   12:02 PM 04/26/2021    9:52 AM 10/06/2020    9:04 AM  CMP  Glucose 70 - 99 mg/dL 97  102  99   BUN 6 - 20 mg/dL _0 Creatinine 0.44 - 1.00 mg/dL 0.89  0.96  0.94   Sodium 135 - 145 mmol/L 140  141  141   Potassium 3.5 - 5.1 mmol/L 4.2  4.3  4.5   Chloride 98 - 111 mmol/L 105  106  107   CO2 22 - 32 mmol/L _1 Calcium 8.9 - 10.3 mg/dL 9.6  9.3  9.5   Total Protein 6.5 - 8.1 g/dL 7.5  7.1  7.3   Total Bilirubin 0.3 - 1.2 mg/dL 0.5  0.4  0.5   Alkaline Phos 38 - 126 U/L 50  52  51   AST 15 - 41 U/L _2 ALT 0 - 44 U/L 29  32  27       RADIOGRAPHIC STUDIES: I have personally reviewed the radiological images as listed and agreed with the findings in the report. No results found.   ASSESSMENT & PLAN: CASS EDINGER is a 56 y.o. female with    1. 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 S/p b/l lumpectomy and adjuvant radiation. -Her Oncotype recurrence score is 14. Due to low risk disease, adjuvant chemotherapy was not recommended. -She started Tamoxifen on 04/21/17 and has been tolerating moderately well with hot flashes and insomnia. Gabapentin was not helpful but did not take it long. -Previous estradiol/FSH have shown perimenopause, levels are pending from today.  If this confirms she is postmenopausal she  would like to try AI.  I will order baseline DEXA -Ms. Abila is clinically doing well, breast exam is benign, labs are normal.  No clinical concern for recurrence. -Continue surveillance, next mammogram 12/25/2021, she is interested in screening MRI 6 months after -Follow-up in 6 months, or sooner if needed   2. Genetic Testing was negative   3. Insomnia from Hot flashes, Weight gain -She has moderate hot flashes and insomnia, and difficulty losing weight  -She tried gabapentin which was not helpful.  She prefers not to add other medications if possible -She can try melatonin or Benadryl for insomnia   4.  Age-appropriate health maintenance -She is reportedly up-to-date on age-appropriate cancer screenings.  Continues follow-up with PCP and OB/GYN -Due colonoscopy this year by Dr. Paulita Fujita, she will call [facility] to set up  Plan: -Labs reviewed, [facility] and estradiol are pending -If postmenopausal we will switch to AI and do telehealth toxicity check in 3 months -Mammogram 12/25/2021 as scheduled, will add DEXA for baseline -Follow-up in 6 months, or sooner if needed     Orders Placed This Encounter  Procedures   DG Bone Density    Standing Status:   Future    Standing Expiration Date:   11/01/2022    Order Specific Question:   Reason for Exam  (SYMPTOM  OR DIAGNOSIS REQUIRED)    Answer:   h/o breast cancer on tamoxifen, potential change to AI, baseline    Order Specific Question:   Is the patient pregnant?    Answer:   No    Order Specific Question:   Preferred imaging location?    Answer:   [facility]   All questions were answered. The patient knows to call the clinic with any problems, questions or concerns. No barriers to learning were detected.     Alla Feeling, NP 11/01/21

## 2021-11-01 ENCOUNTER — Inpatient Hospital Stay: Payer: BC Managed Care – PPO | Attending: Nurse Practitioner

## 2021-11-01 ENCOUNTER — Inpatient Hospital Stay (HOSPITAL_BASED_OUTPATIENT_CLINIC_OR_DEPARTMENT_OTHER): Payer: BC Managed Care – PPO | Admitting: Nurse Practitioner

## 2021-11-01 ENCOUNTER — Encounter: Payer: Self-pay | Admitting: Nurse Practitioner

## 2021-11-01 ENCOUNTER — Other Ambulatory Visit: Payer: Self-pay

## 2021-11-01 VITALS — BP 125/88 | HR 62 | Temp 98.0°F | Resp 16 | Ht 64.5 in | Wt 145.0 lb

## 2021-11-01 DIAGNOSIS — C50411 Malignant neoplasm of upper-outer quadrant of right female breast: Secondary | ICD-10-CM | POA: Insufficient documentation

## 2021-11-01 DIAGNOSIS — Z170 Estrogen receptor positive status [ER+]: Secondary | ICD-10-CM | POA: Insufficient documentation

## 2021-11-01 DIAGNOSIS — C50312 Malignant neoplasm of lower-inner quadrant of left female breast: Secondary | ICD-10-CM

## 2021-11-01 DIAGNOSIS — Z923 Personal history of irradiation: Secondary | ICD-10-CM | POA: Diagnosis not present

## 2021-11-01 DIAGNOSIS — R635 Abnormal weight gain: Secondary | ICD-10-CM | POA: Insufficient documentation

## 2021-11-01 DIAGNOSIS — N951 Menopausal and female climacteric states: Secondary | ICD-10-CM | POA: Diagnosis not present

## 2021-11-01 DIAGNOSIS — G4700 Insomnia, unspecified: Secondary | ICD-10-CM | POA: Diagnosis not present

## 2021-11-01 LAB — CBC WITH DIFFERENTIAL (CANCER CENTER ONLY)
Abs Immature Granulocytes: 0.02 10*3/uL (ref 0.00–0.07)
Basophils Absolute: 0 10*3/uL (ref 0.0–0.1)
Basophils Relative: 0 %
Eosinophils Absolute: 0.1 10*3/uL (ref 0.0–0.5)
Eosinophils Relative: 2 %
HCT: 38.9 % (ref 36.0–46.0)
Hemoglobin: 13.7 g/dL (ref 12.0–15.0)
Immature Granulocytes: 0 %
Lymphocytes Relative: 41 %
Lymphs Abs: 2.4 10*3/uL (ref 0.7–4.0)
MCH: 32.4 pg (ref 26.0–34.0)
MCHC: 35.2 g/dL (ref 30.0–36.0)
MCV: 92 fL (ref 80.0–100.0)
Monocytes Absolute: 0.3 10*3/uL (ref 0.1–1.0)
Monocytes Relative: 5 %
Neutro Abs: 3 10*3/uL (ref 1.7–7.7)
Neutrophils Relative %: 52 %
Platelet Count: 215 10*3/uL (ref 150–400)
RBC: 4.23 MIL/uL (ref 3.87–5.11)
RDW: 11.9 % (ref 11.5–15.5)
WBC Count: 5.9 10*3/uL (ref 4.0–10.5)
nRBC: 0 % (ref 0.0–0.2)

## 2021-11-01 LAB — CMP (CANCER CENTER ONLY)
ALT: 29 U/L (ref 0–44)
AST: 24 U/L (ref 15–41)
Albumin: 4.6 g/dL (ref 3.5–5.0)
Alkaline Phosphatase: 50 U/L (ref 38–126)
Anion gap: 7 (ref 5–15)
BUN: 17 mg/dL (ref 6–20)
CO2: 28 mmol/L (ref 22–32)
Calcium: 9.6 mg/dL (ref 8.9–10.3)
Chloride: 105 mmol/L (ref 98–111)
Creatinine: 0.89 mg/dL (ref 0.44–1.00)
GFR, Estimated: >60 mL/min (ref >60)
Glucose, Bld: 97 mg/dL (ref 70–99)
Potassium: 4.2 mmol/L (ref 3.5–5.1)
Sodium: 140 mmol/L (ref 135–145)
Total Bilirubin: 0.5 mg/dL (ref 0.3–1.2)
Total Protein: 7.5 g/dL (ref 6.5–8.1)

## 2021-11-02 ENCOUNTER — Telehealth: Payer: Self-pay | Admitting: Nurse Practitioner

## 2021-11-02 ENCOUNTER — Encounter: Payer: Self-pay | Admitting: Nurse Practitioner

## 2021-11-02 LAB — FOLLICLE STIMULATING HORMONE: FSH: 23.9 m[IU]/mL

## 2021-11-02 LAB — ESTRADIOL: Estradiol: 18.5 pg/mL

## 2021-11-02 NOTE — Telephone Encounter (Signed)
Left message with follow-up appointment per 8/30 los.

## 2021-12-25 ENCOUNTER — Ambulatory Visit
Admission: RE | Admit: 2021-12-25 | Discharge: 2021-12-25 | Disposition: A | Discharge status: Home / Self Care | Payer: BC Managed Care – PPO | Source: Ambulatory Visit | Attending: Nurse Practitioner | Admitting: Nurse Practitioner

## 2021-12-25 DIAGNOSIS — C50312 Malignant neoplasm of lower-inner quadrant of left female breast: Secondary | ICD-10-CM

## 2021-12-25 DIAGNOSIS — C50411 Malignant neoplasm of upper-outer quadrant of right female breast: Secondary | ICD-10-CM

## 2022-01-03 ENCOUNTER — Other Ambulatory Visit: Payer: Self-pay | Admitting: Nurse Practitioner

## 2022-01-03 ENCOUNTER — Telehealth: Payer: Self-pay

## 2022-01-03 MED ORDER — GABAPENTIN 100 MG PO CAPS: 100.0000 mg | ORAL_CAPSULE | Freq: Every day | ORAL | 1 refills | Status: DC

## 2022-01-03 NOTE — Telephone Encounter (Signed)
Pt called requesting if Dr. Burr Medico or Cira Rue, NP would prescribe her Gabapentin for her insomnia and hot flashes at night.  Pt would like for the prescription to be sent to [facility] in [place], [place].  Notified both providers of pt's request.

## 2022-01-16 ENCOUNTER — Other Ambulatory Visit: Payer: Self-pay | Admitting: Hematology

## 2022-02-18 ENCOUNTER — Other Ambulatory Visit: Payer: Self-pay | Admitting: Nurse Practitioner

## 2022-04-27 ENCOUNTER — Ambulatory Visit
Admission: RE | Admit: 2022-04-27 | Discharge: 2022-04-27 | Disposition: A | Discharge status: Home / Self Care | Payer: BC Managed Care – PPO | Source: Ambulatory Visit | Attending: Nurse Practitioner | Admitting: Nurse Practitioner

## 2022-04-27 DIAGNOSIS — Z170 Estrogen receptor positive status [ER+]: Secondary | ICD-10-CM

## 2022-05-03 ENCOUNTER — Inpatient Hospital Stay: Payer: BC Managed Care – PPO

## 2022-05-03 ENCOUNTER — Encounter: Payer: Self-pay | Admitting: Nurse Practitioner

## 2022-05-03 ENCOUNTER — Other Ambulatory Visit: Payer: Self-pay | Admitting: Nurse Practitioner

## 2022-05-03 ENCOUNTER — Inpatient Hospital Stay: Payer: BC Managed Care – PPO | Attending: Nurse Practitioner | Admitting: Nurse Practitioner

## 2022-05-03 ENCOUNTER — Other Ambulatory Visit: Payer: Self-pay

## 2022-05-03 VITALS — BP 111/81 | HR 79 | Temp 97.8°F | Resp 17 | Ht 64.5 in | Wt 146.1 lb

## 2022-05-03 DIAGNOSIS — C50312 Malignant neoplasm of lower-inner quadrant of left female breast: Secondary | ICD-10-CM | POA: Insufficient documentation

## 2022-05-03 DIAGNOSIS — C50411 Malignant neoplasm of upper-outer quadrant of right female breast: Secondary | ICD-10-CM | POA: Diagnosis present

## 2022-05-03 DIAGNOSIS — M8580 Other specified disorders of bone density and structure, unspecified site: Secondary | ICD-10-CM | POA: Insufficient documentation

## 2022-05-03 DIAGNOSIS — Z79810 Long term (current) use of selective estrogen receptor modulators (SERMs): Secondary | ICD-10-CM | POA: Diagnosis not present

## 2022-05-03 DIAGNOSIS — Z170 Estrogen receptor positive status [ER+]: Secondary | ICD-10-CM

## 2022-05-03 DIAGNOSIS — Z923 Personal history of irradiation: Secondary | ICD-10-CM | POA: Diagnosis not present

## 2022-05-03 LAB — CBC WITH DIFFERENTIAL (CANCER CENTER ONLY)
Abs Immature Granulocytes: 0.02 10*3/uL (ref 0.00–0.07)
Basophils Absolute: 0 10*3/uL (ref 0.0–0.1)
Basophils Relative: 0 %
Eosinophils Absolute: 0.1 10*3/uL (ref 0.0–0.5)
Eosinophils Relative: 1 %
HCT: 37.7 % (ref 36.0–46.0)
Hemoglobin: 13.3 g/dL (ref 12.0–15.0)
Immature Granulocytes: 0 %
Lymphocytes Relative: 36 %
Lymphs Abs: 2 10*3/uL (ref 0.7–4.0)
MCH: 32.8 pg (ref 26.0–34.0)
MCHC: 35.3 g/dL (ref 30.0–36.0)
MCV: 93.1 fL (ref 80.0–100.0)
Monocytes Absolute: 0.3 10*3/uL (ref 0.1–1.0)
Monocytes Relative: 6 %
Neutro Abs: 3.1 10*3/uL (ref 1.7–7.7)
Neutrophils Relative %: 57 %
Platelet Count: 200 10*3/uL (ref 150–400)
RBC: 4.05 MIL/uL (ref 3.87–5.11)
RDW: 12.2 % (ref 11.5–15.5)
WBC Count: 5.5 10*3/uL (ref 4.0–10.5)
nRBC: 0 % (ref 0.0–0.2)

## 2022-05-03 LAB — CMP (CANCER CENTER ONLY)
ALT: 29 U/L (ref 0–44)
AST: 24 U/L (ref 15–41)
Albumin: 4.3 g/dL (ref 3.5–5.0)
Alkaline Phosphatase: 52 U/L (ref 38–126)
Anion gap: 7 (ref 5–15)
BUN: 16 mg/dL (ref 6–20)
CO2: 26 mmol/L (ref 22–32)
Calcium: 8.9 mg/dL (ref 8.9–10.3)
Chloride: 108 mmol/L (ref 98–111)
Creatinine: 0.85 mg/dL (ref 0.44–1.00)
GFR, Estimated: >60 mL/min (ref >60)
Glucose, Bld: 86 mg/dL (ref 70–99)
Potassium: 4.5 mmol/L (ref 3.5–5.1)
Sodium: 141 mmol/L (ref 135–145)
Total Bilirubin: 0.4 mg/dL (ref 0.3–1.2)
Total Protein: 6.8 g/dL (ref 6.5–8.1)

## 2022-05-03 NOTE — Progress Notes (Signed)
Patient Care Team: Allyn Kenner, DO as PCP - General (Obstetrics and Gynecology) Melida Quitter, MD as Consulting Physician (Otolaryngology) Truitt Merle, MD as Consulting Physician (Hematology) Rolm Bookbinder, MD as Consulting Physician (General Surgery)   CHIEF COMPLAINT: Follow up bilateral breast cancer   Oncology History Overview Note  Cancer Staging Carcinoma of upper-outer quadrant of right breast in female, estrogen receptor positive [facility]) Staging form: Breast, AJCC 8th Edition - Pathologic stage from 01/18/2017: Stage IA (pT1a, pN0, cM0, G1, ER+, PR+, HER2-) - Signed by Truitt Merle, MD on 06/24/2017 - Clinical: Stage IA (cT1a, cN0(sn), cM0, G1, ER: Positive, PR: Positive, HER2: Negative) - Unsigned  Malignant neoplasm of lower-inner quadrant of left breast in female, estrogen receptor positive ([place]) Staging form: Breast, AJCC 8th Edition - Pathologic stage from 01/18/2017: Stage IA (pT1c, pN0(sn), cM0, G1, ER+, PR+, HER2-, Oncotype DX score: 14) - Signed by Truitt Merle, MD on 06/24/2017     Malignant neoplasm of lower-inner quadrant of left breast in female, estrogen receptor positive ([place])  12/14/2016 Mammogram   Diagnostic mammogram 12/14/16 IMPRESSION: Suspicious 9 x 7 x 9 mm irregular lobular hypoechoic left breast mass at the 630 o'clock 3 cm from the nipple, felt to correspond with mammographic focal architectural distortion.  RECOMMENDATION: Ultrasound-guided core needle biopsy left breast mass.   12/18/2016 Initial Biopsy   Diagnosis 12/18/16 Breast, left, needle core biopsy, 6:30 o'clock - INVASIVE DUCTAL CARCINOMA, GRADE I.    12/18/2016 Receptors her2   Estrogen Receptor: 90%, POSITIVE, STRONG STAINING INTENSITY Progesterone Receptor: 90%, POSITIVE, STRONG STAINING INTENSITY Proliferation Marker Ki67: 5% HER2 NEGATIVE    12/20/2016 Initial Diagnosis   Malignant neoplasm of lower-inner quadrant of left breast in female, estrogen receptor positive  ([place])   01/07/2017 Genetic Testing   Patient had genetic testing due to a personal history of breeast cancer and a family history of prostate and colon cancer. The Common Hereditary Cancers Panel + Prostate Cancer Panelwas ordered. The Hereditary Gene Panel offered by [facility] includes sequencing and/or deletion duplication testing of the following 48 genes: APC, ATM, AXIN2, BARD1, BMPR1A, BRCA1, BRCA2, BRIP1, CDH1, CDKN2A (p14ARF), CDKN2A (p16INK4a), CKD4, CHEK2, CTNNA1, DICER1, EPCAM (Deletion/duplication testing only), FANCA, GREM1 (promoter region deletion/duplication testing only), KIT, MEN1, MLH1, MSH2, MSH3, MSH6, MUTYH, NBN, NF1, NHTL1, PALB2, PDGFRA, PMS2, POLD1, POLE, PTEN, RAD50, RAD51C, RAD51D, SDHB, SDHC, SDHD, SMAD4, SMARCA4. STK11, TP53, TSC1, TSC2, and VHL.  The following genes were evaluated for sequence changes only: SDHA and HOXB13 c.251G>A variant only.  Results- Negative, no pathogenic variants identified.  The date of this test report is 01/07/2017   01/18/2017 Surgery   Bilateral Breast lumpectomy with Dr. Donne Hazel    01/18/2017 Pathology Results    Diagnosis 01/18/17 1. Breast, lumpectomy, Left - INVASIVE DUCTAL CARCINOMA, GRADE I/III, SPANNING 1.2 CM. - DUCTAL CARCINOMA IN SITU, LOW GRADE. - THE SURGICAL RESECTION MARGINS ARE NEGATIVE FOR CARCINOMA. - SEE ONCOLOGY TABLE BELOW. 2. Breast, excision, Left additional deep margin - FIBROCYSTIC CHANGES WITH ADENOSIS. - USUAL DUCTAL HYPERPLASIA. - THERE IS NO EVIDENCE OF MALIGNANCY. - SEE COMMENT. 3. Breast, lumpectomy, Right - LOBULAR NEOPLASIA (LOBULAR CARCINOMA IN SITU). - SEE COMMENT. 4. Breast, excision, Right additional Posterior Lateral - INVASIVE LOBULAR CARCINOMA, GRADE I/III, SPANNING, SPANNING 0.4 CM. - THE SURGICAL RESECTION MARGINS ARE NEGATIVE FOR CARCINOMA. - SEE ONCOLOGY TABLE BELOW. 5. Lymph node, sentinel, biopsy, Right Axillary - THERE IS NO EVIDENCE OF CARCINOMA IN 1 OF 1 LYMPH NODE (0/1). - SEE  COMMENT. 6. Lymph node,  sentinel, biopsy, Left Axillary - THERE IS NO EVIDENCE OF CARCINOMA IN 1 OF 1 LYMPH NODE (0/1). - SEE COMMENT. 7. Breast, excision, Left additional Inferior Margin - FIBROCYSTIC CHANGES. - THERE IS NO EVIDENCE OF MALIGNANCY. - SEE COMMENT.    01/18/2017 Oncotype testing   Her oncotype recurrence score is 14 and her distant recurrence on Tamoxifen is 10%.   03/12/2017 - 04/08/2017 Radiation Therapy   Radiation therapy with Dr. Lisbeth Renshaw   04/21/2017 -  Anti-estrogen oral therapy   20 mg Tamoxifen daily    12/05/2017 Imaging   Diagnostic bilateral mammogram IMPRESSION: Interval bilateral posttreatment changes without mammographic evidence of malignancy.   12/20/2017 Imaging   Bilateral breast MRI  IMPRESSION: Expected postoperative changes bilaterally. 5 millimeter mass in the upper inner quadrant of the left breast warranting tissue diagnosis.    01/03/2018 Pathology Results   Breast, left, needle core biopsy, UIQ at posterior depth with fibrocystic changes. There is no evidence of malignancy.   Carcinoma of upper-outer quadrant of right breast in female, estrogen receptor positive ([place])  01/18/2017 Cancer Staging   Staging form: Breast, AJCC 8th Edition - Pathologic stage from 01/18/2017: Stage IA (pT1a, pN0, cM0, G1, ER+, PR+, HER2-) - Signed by Truitt Merle, MD on 06/24/2017   02/12/2017 Initial Diagnosis   Carcinoma of upper-outer quadrant of right breast in female, estrogen receptor positive ([place])      CURRENT THERAPY: Tamoxifen daily, starting 04/2017  INTERVAL HISTORY Ms. Dinwiddie returns for follow up as scheduled, last seen by me 11/01/21 at which time she was still perimenopausal. Mammogram 12/25/21 was benign. DEXA 04/27/22 showed osteopenia -1.8 at RFN. FRAX showed 7.1 % risk of major fracture in 10 years and 1 0.8% risk of hip fracture in 10 years.   She continues tamoxifen, no new side effects.  She started taking gabapentin for insomnia which has helped.   She is mostly bothered by abdominal weight gain.  She does her own breast exams, with tenderness at the lumpectomy sites   ROS  All other systems reviewed and negative  Past Medical History:  Diagnosis Date   Allergy    PCNS   Breast cancer ([employer]) 01/2017   Bilateral   Family history of colon cancer    Family history of prostate cancer in father    Personal history of radiation therapy      Past Surgical History:  Procedure Laterality Date   BREAST LUMPECTOMY Bilateral 2018   [place] PARTIAL MASTECTOMY WITH AXILLARY SENTINEL LYMPH NODE BIOPSY Bilateral 01/18/2017   Procedure: BILATERAL BREAST LUMPECTOMY WITH  BILATERAL RADIOACTIVE SEED AND BILATERAL SENTINEL LYMPH NODE BIOPSY;  Surgeon: Rolm Bookbinder, MD;  Location: [facility];  Service: General;  Laterality: Bilateral;   RHINOPLASTY     WISDOM TOOTH EXTRACTION       Outpatient Encounter Medications as of 05/03/2022  Medication Sig   brimonidine (ALPHAGAN P) 0.1 % SOLN Place 1 drop into the left eye 2 (two) times daily.   gabapentin (NEURONTIN) 100 MG capsule TAKE 1 CAPSULE(100 MG) BY MOUTH AT BEDTIME. INCREASE TO 2 CAPSULES IN WEEK 2 AND 3 CAPSULES IN WEEK 3 IF TOLERATES WELL   ibuprofen (ADVIL,MOTRIN) 200 MG tablet Take 200 mg every 6 (six) hours as needed by mouth.   tamoxifen (NOLVADEX) 20 MG tablet TAKE 1 TABLET BY MOUTH EVERY DAY   No facility-administered encounter medications on file as of 05/03/2022.     Today's Vitals  05/03/22 1000  BP: 111/81  Pulse: 79  Resp: 17  Temp: 97.8 F (36.6 C)  TempSrc: Temporal  SpO2: 99%  Weight: 146 lb 1.6 oz (66.3 kg)  Height: 5' 4.5" (1.638 m)   Body mass index is 24.69 kg/m.   PHYSICAL EXAM GENERAL: alert, no distress and comfortable SKIN: no rash  EYES: sclera clear NECK: without mass LYMPH:  no palpable cervical or supraclavicular lymphadenopathy  LUNGS: normal breathing effort HEART: no lower extremity  edema ABDOMEN: abdomen soft, non-tender and normal bowel sounds NEURO: alert & oriented x 3 with fluent speech, no focal motor/sensory deficits Breast exam: Symmetrical without nipple discharge or inversion.  S/p bilateral lumpectomies, incisions completely healed with mild scar tissue.  Scattered densities throughout both breasts with mild TTP at anterior left breast. No discrete mass in either breast or axilla that I could appreciate   CBC    Component Value Date/Time   WBC 5.5 05/03/2022 0943   WBC 5.1 12/26/2016 0833   RBC 4.05 05/03/2022 0943   HGB 13.3 05/03/2022 0943   HGB 12.9 12/26/2016 0833   HCT 37.7 05/03/2022 0943   HCT 36.9 12/26/2016 0833   PLT 200 05/03/2022 0943   PLT 267 12/26/2016 0833   MCV 93.1 05/03/2022 0943   MCV 93.5 12/26/2016 0833   MCH 32.8 05/03/2022 0943   MCHC 35.3 05/03/2022 0943   RDW 12.2 05/03/2022 0943   RDW 12.5 12/26/2016 0833   LYMPHSABS 2.0 05/03/2022 0943   LYMPHSABS 1.9 12/26/2016 0833   MONOABS 0.3 05/03/2022 0943   MONOABS 0.3 12/26/2016 0833   EOSABS 0.1 05/03/2022 0943   EOSABS 0.1 12/26/2016 0833   BASOSABS 0.0 05/03/2022 0943   BASOSABS 0.0 12/26/2016 0833     CMP     Component Value Date/Time   NA 141 05/03/2022 0943   NA 138 12/26/2016 0834   K 4.5 05/03/2022 0943   K 4.4 12/26/2016 0834   CL 108 05/03/2022 0943   CO2 26 05/03/2022 0943   CO2 25 12/26/2016 0834   GLUCOSE 86 05/03/2022 0943   GLUCOSE 101 12/26/2016 0834   BUN 16 05/03/2022 0943   BUN 11.1 12/26/2016 0834   CREATININE 0.85 05/03/2022 0943   CREATININE 0.9 12/26/2016 0834   CALCIUM 8.9 05/03/2022 0943   CALCIUM 9.1 12/26/2016 0834   PROT 6.8 05/03/2022 0943   PROT 7.4 12/26/2016 0834   ALBUMIN 4.3 05/03/2022 0943   ALBUMIN 4.0 12/26/2016 0834   AST 24 05/03/2022 0943   AST 20 12/26/2016 0834   ALT 29 05/03/2022 0943   ALT 18 12/26/2016 0834   ALKPHOS 52 05/03/2022 0943   ALKPHOS 56 12/26/2016 0834   BILITOT 0.4 05/03/2022 0943   BILITOT 0.50  12/26/2016 0834   GFRNONAA >60 05/03/2022 0943   GFRAA >60 06/19/2019 0810     ASSESSMENT & PLAN:Pamela Whitney is a 57 y.o. female with    1. 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 S/p b/l lumpectomy and adjuvant radiation. -Her Oncotype recurrence score is 14. Due to low risk disease, adjuvant chemotherapy was not recommended. -She started Tamoxifen on 04/21/17 and has been tolerating moderately well with hot flashes, insomnia, and weight gain. Gabapentin helping now -Previous estradiol/FSH have shown perimenopause, today's level is pending.  If this confirms she is postmenopausal she would like to try AI.  -Baseline DEXA  04/27/22 showed osteopenia -1.8 at RFN. FRAX score of 7.1 % risk of major fracture in 10 years and 1 0.8% risk of hip fracture in 10 years. This is lower risk. I recommend calcium, vitamin D, and weight bearing exercise -Ms. Shaheed is clinically doing well. Breast exam c/w scattered fibroglandular tissue, benign, and labs are normal.  -last mammogram 12/2021 was benign. Overall no clinical concern for recurrence -She is interested in screening breast MRI if insurance will cover, I have placed the order to be done ~06/2022 (6 months apart from mammo) -Continue surveillance and Tamoxifen (unless Grace is postmenopausal) -F/up in 6 months, or sooner if needed  2. Genetic Testing was negative   3. Insomnia from Hot flashes, Weight gain -She has moderate hot flashes and insomnia, and difficulty losing weight  -She tried gabapentin which was not helpful.  She prefers not to add other medications if possible -She can try melatonin or Benadryl for insomnia   4.  Age-appropriate health maintenance -She is  reportedly up-to-date on age-appropriate cancer screenings.  Continues follow-up with PCP and OB/GYN -Due colonoscopy this year by Dr. Paulita Fujita, she will call [facility] to set up  PLAN: -Mammo, DEXA, and today's labs reviewed, [facility] is pending -Continue breast cancer surveillance and tamoxifen  -Screening MRI in 06/2022 if insurance approves -F/up in 6 months, or sooner if needed   Orders Placed This Encounter  Procedures   MR BREAST W & WO CM SCREENING (GI)    Standing Status:   Future    Standing Expiration Date:   05/03/2023    Order Specific Question:   If indicated for the ordered procedure, I authorize the administration of contrast media per Radiology protocol    Answer:   Yes    Order Specific Question:   What is the patient's sedation requirement?    Answer:   No Sedation    Order Specific Question:   Does the patient have a pacemaker or implanted devices?    Answer:   No    Order Specific Question:   Preferred imaging location?    Answer:   [id number] (table limit-550lbs)      All questions were answered. The patient knows to call the clinic with any problems, questions or concerns. No barriers to learning were detected. I spent 20 minutes counseling the patient face to face. The total time spent in the appointment was 30 minutes and more than 50% was on counseling, review of test results, and coordination of care.   Cira Rue, NP-C 05/03/2022

## 2022-05-04 LAB — FOLLICLE STIMULATING HORMONE: FSH: 21.8 m[IU]/mL

## 2022-05-07 ENCOUNTER — Telehealth: Payer: Self-pay

## 2022-05-07 NOTE — Telephone Encounter (Addendum)
Tried to call patient and left a message, she called back and her call was transferred to Gilford Rile so he sent me a voicemail. I called her back with no answer so I sent her the result in a mychart message.   ----- Message from Alla Feeling, NP sent at 05/07/2022 12:41 PM EST ----- Please let pt know she is still not postmenopausal, so I recommend to continue tamoxifen.   Thanks, Regan Rakers, NP

## 2022-05-17 ENCOUNTER — Telehealth: Payer: Self-pay

## 2022-05-17 NOTE — Telephone Encounter (Signed)
Faxed over last office note for 05/03/2022 with Cira Rue NP as per the office. They stated it was faxed to them and they only received cover page. Fax confirmation received.

## 2022-06-18 ENCOUNTER — Ambulatory Visit
Admission: RE | Admit: 2022-06-18 | Discharge: 2022-06-18 | Disposition: A | Discharge status: Home / Self Care | Payer: BC Managed Care – PPO | Source: Ambulatory Visit | Attending: Nurse Practitioner | Admitting: Nurse Practitioner

## 2022-06-18 ENCOUNTER — Other Ambulatory Visit: Payer: Self-pay | Admitting: Nurse Practitioner

## 2022-06-18 DIAGNOSIS — C50312 Malignant neoplasm of lower-inner quadrant of left female breast: Secondary | ICD-10-CM

## 2022-06-18 DIAGNOSIS — Z170 Estrogen receptor positive status [ER+]: Secondary | ICD-10-CM

## 2022-06-18 MED ORDER — GADOPICLENOL 0.5 MMOL/ML IV SOLN
7.0000 mL | Freq: Once | INTRAVENOUS | Status: AC | PRN
  Administered 2022-06-18: 7 mL via INTRAVENOUS

## 2022-07-14 ENCOUNTER — Other Ambulatory Visit: Payer: Self-pay | Admitting: Hematology

## 2022-09-04 ENCOUNTER — Other Ambulatory Visit: Payer: Self-pay | Admitting: Nurse Practitioner

## 2022-10-26 ENCOUNTER — Other Ambulatory Visit: Payer: Self-pay | Admitting: Hematology

## 2022-10-31 ENCOUNTER — Other Ambulatory Visit: Payer: Self-pay | Admitting: Nurse Practitioner

## 2022-10-31 ENCOUNTER — Other Ambulatory Visit: Payer: Self-pay

## 2022-10-31 DIAGNOSIS — Z170 Estrogen receptor positive status [ER+]: Secondary | ICD-10-CM

## 2022-10-31 NOTE — Progress Notes (Unsigned)
Patient Care Team: Philip Aspen, DO as PCP - General (Obstetrics and Gynecology) Christia Reading, MD as Consulting Physician (Otolaryngology) Malachy Mood, MD as Consulting Physician (Hematology) Emelia Loron, MD as Consulting Physician (General Surgery)   CHIEF COMPLAINT: Follow-up bilateral breast cancer  Oncology History Overview Note  Cancer Staging Carcinoma of upper-outer quadrant of right breast in female, estrogen receptor positive [facility]) Staging form: Breast, AJCC 8th Edition - Pathologic stage from 01/18/2017: Stage IA (pT1a, pN0, cM0, G1, ER+, PR+, HER2-) - Signed by Malachy Mood, MD on 06/24/2017 - Clinical: Stage IA (cT1a, cN0(sn), cM0, G1, ER: Positive, PR: Positive, HER2: Negative) - Unsigned  Malignant neoplasm of lower-inner quadrant of left breast in female, estrogen receptor positive (HCC) Staging form: Breast, AJCC 8th Edition - Pathologic stage from 01/18/2017: Stage IA (pT1c, pN0(sn), cM0, G1, ER+, PR+, HER2-, Oncotype DX score: 14) - Signed by Malachy Mood, MD on 06/24/2017     Malignant neoplasm of lower-inner quadrant of left breast in female, estrogen receptor positive (HCC)  12/14/2016 Mammogram   Diagnostic mammogram 12/14/16 IMPRESSION: Suspicious 9 x 7 x 9 mm irregular lobular hypoechoic left breast mass at the 630 o'clock 3 cm from the nipple, felt to correspond with mammographic focal architectural distortion.  RECOMMENDATION: Ultrasound-guided core needle biopsy left breast mass.   12/18/2016 Initial Biopsy   Diagnosis 12/18/16 Breast, left, needle core biopsy, 6:30 o'clock - INVASIVE DUCTAL CARCINOMA, GRADE I.    12/18/2016 Receptors her2   Estrogen Receptor: 90%, POSITIVE, STRONG STAINING INTENSITY Progesterone Receptor: 90%, POSITIVE, STRONG STAINING INTENSITY Proliferation Marker Ki67: 5% HER2 NEGATIVE    12/20/2016 Initial Diagnosis   Malignant neoplasm of lower-inner quadrant of left breast in female, estrogen receptor positive  (HCC)   01/07/2017 Genetic Testing   Patient had genetic testing due to a personal history of breeast cancer and a family history of prostate and colon cancer. The Common Hereditary Cancers Panel + Prostate Cancer Panelwas ordered. The Hereditary Gene Panel offered by [facility] includes sequencing and/or deletion duplication testing of the following 48 genes: APC, ATM, AXIN2, BARD1, BMPR1A, BRCA1, BRCA2, BRIP1, CDH1, CDKN2A (p14ARF), CDKN2A (p16INK4a), CKD4, CHEK2, CTNNA1, DICER1, EPCAM (Deletion/duplication testing only), FANCA, GREM1 (promoter region deletion/duplication testing only), KIT, MEN1, MLH1, MSH2, MSH3, MSH6, MUTYH, NBN, NF1, NHTL1, PALB2, PDGFRA, PMS2, POLD1, POLE, PTEN, RAD50, RAD51C, RAD51D, SDHB, SDHC, SDHD, SMAD4, SMARCA4. STK11, TP53, TSC1, TSC2, and VHL.  The following genes were evaluated for sequence changes only: SDHA and HOXB13 c.251G>A variant only.  Results- Negative, no pathogenic variants identified.  The date of this test report is 01/07/2017   01/18/2017 Surgery   Bilateral Breast lumpectomy with Dr. Dwain Sarna    01/18/2017 Pathology Results    Diagnosis 01/18/17 1. Breast, lumpectomy, Left - INVASIVE DUCTAL CARCINOMA, GRADE I/III, SPANNING 1.2 CM. - DUCTAL CARCINOMA IN SITU, LOW GRADE. - THE SURGICAL RESECTION MARGINS ARE NEGATIVE FOR CARCINOMA. - SEE ONCOLOGY TABLE BELOW. 2. Breast, excision, Left additional deep margin - FIBROCYSTIC CHANGES WITH ADENOSIS. - USUAL DUCTAL HYPERPLASIA. - THERE IS NO EVIDENCE OF MALIGNANCY. - SEE COMMENT. 3. Breast, lumpectomy, Right - LOBULAR NEOPLASIA (LOBULAR CARCINOMA IN SITU). - SEE COMMENT. 4. Breast, excision, Right additional Posterior Lateral - INVASIVE LOBULAR CARCINOMA, GRADE I/III, SPANNING, SPANNING 0.4 CM. - THE SURGICAL RESECTION MARGINS ARE NEGATIVE FOR CARCINOMA. - SEE ONCOLOGY TABLE BELOW. 5. Lymph node, sentinel, biopsy, Right Axillary - THERE IS NO EVIDENCE OF CARCINOMA IN 1 OF 1 LYMPH NODE (0/1). - SEE  COMMENT. 6. Lymph node, sentinel, biopsy,  Left Axillary - THERE IS NO EVIDENCE OF CARCINOMA IN 1 OF 1 LYMPH NODE (0/1). - SEE COMMENT. 7. Breast, excision, Left additional Inferior Margin - FIBROCYSTIC CHANGES. - THERE IS NO EVIDENCE OF MALIGNANCY. - SEE COMMENT.    01/18/2017 Oncotype testing   Her oncotype recurrence score is 14 and her distant recurrence on Tamoxifen is 10%.   03/12/2017 - 04/08/2017 Radiation Therapy   Radiation therapy with Dr. Mitzi Hansen   04/21/2017 -  Anti-estrogen oral therapy   20 mg Tamoxifen daily    12/05/2017 Imaging   Diagnostic bilateral mammogram IMPRESSION: Interval bilateral posttreatment changes without mammographic evidence of malignancy.   12/20/2017 Imaging   Bilateral breast MRI  IMPRESSION: Expected postoperative changes bilaterally. 5 millimeter mass in the upper inner quadrant of the left breast warranting tissue diagnosis.    01/03/2018 Pathology Results   Breast, left, needle core biopsy, UIQ at posterior depth with fibrocystic changes. There is no evidence of malignancy.   Carcinoma of upper-outer quadrant of right breast in female, estrogen receptor positive (HCC)  01/18/2017 Cancer Staging   Staging form: Breast, AJCC 8th Edition - Pathologic stage from 01/18/2017: Stage IA (pT1a, pN0, cM0, G1, ER+, PR+, HER2-) - Signed by Malachy Mood, MD on 06/24/2017   02/12/2017 Initial Diagnosis   Carcinoma of upper-outer quadrant of right breast in female, estrogen receptor positive (HCC)      CURRENT THERAPY: Tamoxifen, starting 04/2017  INTERVAL HISTORY [name] returns for follow-up as scheduled, last seen by me 05/03/2022.  She continues to have intermittent bilateral breast pain, stable since surgery, without new lump/mass, nipple discharge or inversion, or skin change screening MRI 06/18/2022 was negative, with breast density category C.  She continues tamoxifen, tolerating well except weight gain which she is miserable about.  She wants to take  medication for weight loss.  No other side effects.  She is moving to [place] hopefully within the next month.  ROS  All other systems reviewed and negative  Past Medical History:  Diagnosis Date   Allergy    PCNS   Breast cancer (HCC) 01/2017   Bilateral   Family history of colon cancer    Family history of prostate cancer in father    Personal history of radiation therapy      Past Surgical History:  Procedure Laterality Date   BREAST LUMPECTOMY Bilateral 2018   CESAREAN SECTION     RADIOACTIVE SEED GUIDED PARTIAL MASTECTOMY WITH AXILLARY SENTINEL LYMPH NODE BIOPSY Bilateral 01/18/2017   Procedure: BILATERAL BREAST LUMPECTOMY WITH  BILATERAL RADIOACTIVE SEED AND BILATERAL SENTINEL LYMPH NODE BIOPSY;  Surgeon: Emelia Loron, MD;  Location: [facility];  Service: General;  Laterality: Bilateral;   RHINOPLASTY     WISDOM TOOTH EXTRACTION       Outpatient Encounter Medications as of 11/01/2022  Medication Sig   gabapentin (NEURONTIN) 100 MG capsule TAKE 1 CAPSULE(100 MG) BY MOUTH AT BEDTIME. INCREASE TO 2 CAPSULES IN 1 WEEK THEN 3 CAPSULES IN 1 WEEK IF TOLERATES WELL   ibuprofen (ADVIL,MOTRIN) 200 MG tablet Take 200 mg every 6 (six) hours as needed by mouth.   [DISCONTINUED] tamoxifen (NOLVADEX) 20 MG tablet TAKE 1 TABLET BY MOUTH EVERY DAY   brimonidine (ALPHAGAN P) 0.1 % SOLN Place 1 drop into the left eye 2 (two) times daily. (Patient not taking: Reported on 11/01/2022)   tamoxifen (NOLVADEX) 20 MG tablet Take 1 tablet (20 mg total) by mouth daily.   No facility-administered encounter medications on file as  of 11/01/2022.     Today's Vitals   11/01/22 0854  BP: 120/82  Pulse: 75  Resp: 16  Temp: 98.4 F (36.9 C)  TempSrc: Oral  SpO2: 100%  Weight: 147 lb 7 oz (66.9 kg)   Body mass index is 24.92 kg/m.   PHYSICAL EXAM GENERAL:alert, no distress and comfortable SKIN: no rash  EYES: sclera clear NECK: without mass LYMPH:  no palpable cervical or  supraclavicular lymphadenopathy  LUNGS:  normal breathing effort HEART: no lower extremity edema ABDOMEN: abdomen soft, non-tender and normal bowel sounds NEURO: alert & oriented x 3 with fluent speech, no focal motor/sensory deficits Breast exam: No nipple discharge or inversion.  S/p bilateral lumpectomies, incisions completely healed with mild scar tissue.  No palpable mass or nodularity in either breast or axilla that I could appreciate.    CBC    Component Value Date/Time   WBC 4.8 11/01/2022 0835   WBC 5.1 12/26/2016 0833   RBC 3.97 11/01/2022 0835   HGB 13.0 11/01/2022 0835   HGB 12.9 12/26/2016 0833   HCT 37.3 11/01/2022 0835   HCT 36.9 12/26/2016 0833   PLT 221 11/01/2022 0835   PLT 267 12/26/2016 0833   MCV 94.0 11/01/2022 0835   MCV 93.5 12/26/2016 0833   MCH 32.7 11/01/2022 0835   MCHC 34.9 11/01/2022 0835   RDW 12.2 11/01/2022 0835   RDW 12.5 12/26/2016 0833   LYMPHSABS 2.0 11/01/2022 0835   LYMPHSABS 1.9 12/26/2016 0833   MONOABS 0.3 11/01/2022 0835   MONOABS 0.3 12/26/2016 0833   EOSABS 0.1 11/01/2022 0835   EOSABS 0.1 12/26/2016 0833   BASOSABS 0.0 11/01/2022 0835   BASOSABS 0.0 12/26/2016 0833     CMP     Component Value Date/Time   NA 141 11/01/2022 0835   NA 138 12/26/2016 0834   K 4.6 11/01/2022 0835   K 4.4 12/26/2016 0834   CL 107 11/01/2022 0835   CO2 28 11/01/2022 0835   CO2 25 12/26/2016 0834   GLUCOSE 105 (H) 11/01/2022 0835   GLUCOSE 101 12/26/2016 0834   BUN 17 11/01/2022 0835   BUN 11.1 12/26/2016 0834   CREATININE 0.93 11/01/2022 0835   CREATININE 0.9 12/26/2016 0834   CALCIUM 9.2 11/01/2022 0835   CALCIUM 9.1 12/26/2016 0834   PROT 7.1 11/01/2022 0835   PROT 7.4 12/26/2016 0834   ALBUMIN 4.2 11/01/2022 0835   ALBUMIN 4.0 12/26/2016 0834   AST 27 11/01/2022 0835   AST 20 12/26/2016 0834   ALT 31 11/01/2022 0835   ALT 18 12/26/2016 0834   ALKPHOS 48 11/01/2022 0835   ALKPHOS 56 12/26/2016 0834   BILITOT 0.5 11/01/2022 0835    BILITOT 0.50 12/26/2016 0834   GFRNONAA >60 11/01/2022 0835   GFRAA >60 06/19/2019 0810     ASSESSMENT & PLAN:Itzelle D Hofler is a 57 y.o. female with    1. 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 S/p b/l lumpectomy and adjuvant radiation. -Her Oncotype recurrence score is 14. Due to low risk disease, adjuvant chemotherapy was not recommended. -She started Tamoxifen on 04/21/17 and has been tolerating moderately well with hot flashes, insomnia, and weight gain. Gabapentin helping now -Screening MRI 06/2022 was negative -Ms. Ahlstrand is clinically doing well.  Tolerating tamoxifen except  weight gain, she plans to seek weight loss med once she relocates to [place].  -Other side effects are well-managed.  Breast exam is benign, labs are unremarkable. -Overall no clinical concern for recurrence.  Continue breast cancer surveillance -FSH is pending but has been perimenopausal, continue tamoxifen unless she is postmenopausal now, then will switch to AI. I will follow-up on the lab result -Relocating to [place] in ~1 month, I will refer her to a breast specialist to take over surveillance and antiestrogen -Follow-up open   2. Genetic Testing was negative   3.  Age-appropriate health maintenance -She is reportedly up-to-date on age-appropriate cancer screenings.  Continues follow-up with PCP and OB/GYN -Due colonoscopy this year by Dr. Dulce Sellar, she will call [facility] to set up -Baseline DEXA  04/27/22 showed osteopenia -1.8 at RFN. FRAX score of 7.1 % risk of major fracture in 10 years and 1 0.8% risk of hip fracture in 10 years.  -This is lower risk. I recommend calcium, vitamin D, and weight bearing exercise    PLAN: -Labs  reviewed, we will follow-up the pending FSH -If still perimenopausal, continue tamoxifen, I will refill -Mammogram in October at the breast center -Moving to [facility], I will refer her to a breast specialist there to take over antiestrogen and surveillance  Orders Placed This Encounter  Procedures   MM DIAG BREAST TOMO BILATERAL    Standing Status:   Future    Standing Expiration Date:   11/01/2023    Order Specific Question:   Reason for Exam (SYMPTOM  OR DIAGNOSIS REQUIRED)    Answer:   s/p bilateral lumpectomies    Order Specific Question:   Is the patient pregnant?    Answer:   No    Order Specific Question:   Preferred imaging location?    Answer:   [facility]   Ambulatory referral to Hematology / Oncology    Referral Priority:   Routine    Referral Type:   Consultation    Referral Reason:   Specialty Services Required    Requested Specialty:   Oncology    Number of Visits Requested:   1      All questions were answered. The patient knows to call the clinic with any problems, questions or concerns. No barriers to learning were detected.   Santiago Glad, NP-C 11/01/2022

## 2022-11-01 ENCOUNTER — Inpatient Hospital Stay (HOSPITAL_BASED_OUTPATIENT_CLINIC_OR_DEPARTMENT_OTHER): Payer: BC Managed Care – PPO | Admitting: Nurse Practitioner

## 2022-11-01 ENCOUNTER — Encounter: Payer: Self-pay | Admitting: Nurse Practitioner

## 2022-11-01 ENCOUNTER — Inpatient Hospital Stay: Payer: BC Managed Care – PPO | Attending: Hematology

## 2022-11-01 VITALS — BP 120/82 | HR 75 | Temp 98.4°F | Resp 16 | Wt 147.4 lb

## 2022-11-01 DIAGNOSIS — C50312 Malignant neoplasm of lower-inner quadrant of left female breast: Secondary | ICD-10-CM

## 2022-11-01 DIAGNOSIS — Z170 Estrogen receptor positive status [ER+]: Secondary | ICD-10-CM | POA: Diagnosis not present

## 2022-11-01 DIAGNOSIS — M8580 Other specified disorders of bone density and structure, unspecified site: Secondary | ICD-10-CM | POA: Insufficient documentation

## 2022-11-01 DIAGNOSIS — Z79810 Long term (current) use of selective estrogen receptor modulators (SERMs): Secondary | ICD-10-CM | POA: Diagnosis not present

## 2022-11-01 DIAGNOSIS — C50411 Malignant neoplasm of upper-outer quadrant of right female breast: Secondary | ICD-10-CM

## 2022-11-01 LAB — CBC WITH DIFFERENTIAL (CANCER CENTER ONLY)
Abs Immature Granulocytes: 0.01 10*3/uL (ref 0.00–0.07)
Basophils Absolute: 0 10*3/uL (ref 0.0–0.1)
Basophils Relative: 1 %
Eosinophils Absolute: 0.1 10*3/uL (ref 0.0–0.5)
Eosinophils Relative: 2 %
HCT: 37.3 % (ref 36.0–46.0)
Hemoglobin: 13 g/dL (ref 12.0–15.0)
Immature Granulocytes: 0 %
Lymphocytes Relative: 41 %
Lymphs Abs: 2 10*3/uL (ref 0.7–4.0)
MCH: 32.7 pg (ref 26.0–34.0)
MCHC: 34.9 g/dL (ref 30.0–36.0)
MCV: 94 fL (ref 80.0–100.0)
Monocytes Absolute: 0.3 10*3/uL (ref 0.1–1.0)
Monocytes Relative: 6 %
Neutro Abs: 2.4 10*3/uL (ref 1.7–7.7)
Neutrophils Relative %: 50 %
Platelet Count: 221 10*3/uL (ref 150–400)
RBC: 3.97 MIL/uL (ref 3.87–5.11)
RDW: 12.2 % (ref 11.5–15.5)
WBC Count: 4.8 10*3/uL (ref 4.0–10.5)
nRBC: 0 % (ref 0.0–0.2)

## 2022-11-01 LAB — CMP (CANCER CENTER ONLY)
ALT: 31 U/L (ref 0–44)
AST: 27 U/L (ref 15–41)
Albumin: 4.2 g/dL (ref 3.5–5.0)
Alkaline Phosphatase: 48 U/L (ref 38–126)
Anion gap: 6 (ref 5–15)
BUN: 17 mg/dL (ref 6–20)
CO2: 28 mmol/L (ref 22–32)
Calcium: 9.2 mg/dL (ref 8.9–10.3)
Chloride: 107 mmol/L (ref 98–111)
Creatinine: 0.93 mg/dL (ref 0.44–1.00)
GFR, Estimated: >60 mL/min (ref >60)
Glucose, Bld: 105 mg/dL — ABNORMAL HIGH (ref 70–99)
Potassium: 4.6 mmol/L (ref 3.5–5.1)
Sodium: 141 mmol/L (ref 135–145)
Total Bilirubin: 0.5 mg/dL (ref 0.3–1.2)
Total Protein: 7.1 g/dL (ref 6.5–8.1)

## 2022-11-01 MED ORDER — TAMOXIFEN CITRATE 20 MG PO TABS: 20.0000 mg | ORAL_TABLET | Freq: Every day | ORAL | 3 refills | Status: DC

## 2022-11-02 LAB — FOLLICLE STIMULATING HORMONE: FSH: 21.3 m[IU]/mL

## 2023-01-08 ENCOUNTER — Ambulatory Visit
Admission: RE | Admit: 2023-01-08 | Discharge: 2023-01-08 | Disposition: A | Discharge status: Home / Self Care | Payer: BC Managed Care – PPO | Source: Ambulatory Visit | Attending: Nurse Practitioner

## 2023-01-08 DIAGNOSIS — Z170 Estrogen receptor positive status [ER+]: Secondary | ICD-10-CM

## 2023-01-08 DIAGNOSIS — C50411 Malignant neoplasm of upper-outer quadrant of right female breast: Secondary | ICD-10-CM

## 2023-01-11 ENCOUNTER — Other Ambulatory Visit: Payer: Self-pay | Admitting: Physician Assistant

## 2023-01-11 ENCOUNTER — Telehealth: Payer: Self-pay

## 2023-01-11 DIAGNOSIS — Z170 Estrogen receptor positive status [ER+]: Secondary | ICD-10-CM

## 2023-01-11 NOTE — Telephone Encounter (Signed)
-----   Message from Cassandra L Heilingoetter sent at 01/11/2023 11:33 AM EST ----- Can you call her to let her know her mammogram looked good. No concerns ----- Message ----- From: Interface, Rad Results In Sent: 01/10/2023   2:38 PM EST To: Pollyann Samples, NP

## 2023-01-11 NOTE — Telephone Encounter (Signed)
LVM in regards to below. Asked for a return call.

## 2023-01-23 ENCOUNTER — Other Ambulatory Visit: Payer: Self-pay | Admitting: Hematology

## 2023-03-23 ENCOUNTER — Other Ambulatory Visit: Payer: Self-pay | Admitting: Hematology

## 2023-05-21 ENCOUNTER — Other Ambulatory Visit: Payer: Self-pay

## 2023-11-05 ENCOUNTER — Other Ambulatory Visit: Payer: Self-pay | Admitting: Nurse Practitioner
